# Patient Record
Sex: Male | Born: 1955 | Race: White | Hispanic: No | Marital: Married | Smoking: Former smoker
Health system: Southern US, Community
[De-identification: ages and names within clinical notes are randomized; demographics above are authoritative.]

## PROBLEM LIST (undated history)

## (undated) DIAGNOSIS — G4700 Insomnia, unspecified: Secondary | ICD-10-CM

## (undated) DIAGNOSIS — C61 Malignant neoplasm of prostate: Secondary | ICD-10-CM

## (undated) DIAGNOSIS — R7303 Prediabetes: Secondary | ICD-10-CM

## (undated) DIAGNOSIS — C01 Malignant neoplasm of base of tongue: Secondary | ICD-10-CM

## (undated) DIAGNOSIS — Z923 Personal history of irradiation: Secondary | ICD-10-CM

## (undated) DIAGNOSIS — I82409 Acute embolism and thrombosis of unspecified deep veins of unspecified lower extremity: Secondary | ICD-10-CM

## (undated) DIAGNOSIS — K5090 Crohn's disease, unspecified, without complications: Secondary | ICD-10-CM

## (undated) DIAGNOSIS — J189 Pneumonia, unspecified organism: Secondary | ICD-10-CM

## (undated) DIAGNOSIS — E119 Type 2 diabetes mellitus without complications: Secondary | ICD-10-CM

## (undated) HISTORY — PX: PORT-A-CATH REMOVAL: SHX5289

## (undated) HISTORY — DX: Pneumonia, unspecified organism: J18.9

## (undated) HISTORY — DX: Malignant neoplasm of base of tongue: C01

## (undated) HISTORY — DX: Insomnia, unspecified: G47.00

## (undated) HISTORY — DX: Prediabetes: R73.03

## (undated) HISTORY — DX: Acute embolism and thrombosis of unspecified deep veins of unspecified lower extremity: I82.409

## (undated) HISTORY — DX: Crohn's disease, unspecified, without complications: K50.90

## (undated) HISTORY — PX: OTHER SURGICAL HISTORY: SHX169

## (undated) HISTORY — DX: Type 2 diabetes mellitus without complications: E11.9

## (undated) HISTORY — DX: Personal history of irradiation: Z92.3

## (undated) HISTORY — PX: PEG TUBE PLACEMENT: SUR1034

---

## 1998-12-15 ENCOUNTER — Other Ambulatory Visit: Admission: RE | Admit: 1998-12-15 | Discharge: 1998-12-15 | Payer: Self-pay | Admitting: Gastroenterology

## 2003-09-29 ENCOUNTER — Encounter: Admission: RE | Admit: 2003-09-29 | Discharge: 2003-09-29 | Payer: Self-pay | Admitting: Rheumatology

## 2009-11-08 DIAGNOSIS — C01 Malignant neoplasm of base of tongue: Secondary | ICD-10-CM

## 2009-11-08 HISTORY — DX: Malignant neoplasm of base of tongue: C01

## 2011-04-14 ENCOUNTER — Ambulatory Visit
Admission: RE | Admit: 2011-04-14 | Discharge: 2011-04-14 | Disposition: A | Discharge status: Home / Self Care | Payer: 59 | Source: Ambulatory Visit | Attending: Radiation Oncology | Admitting: Radiation Oncology

## 2011-04-14 DIAGNOSIS — C01 Malignant neoplasm of base of tongue: Secondary | ICD-10-CM | POA: Insufficient documentation

## 2011-04-14 DIAGNOSIS — K5090 Crohn's disease, unspecified, without complications: Secondary | ICD-10-CM | POA: Insufficient documentation

## 2011-04-14 DIAGNOSIS — C770 Secondary and unspecified malignant neoplasm of lymph nodes of head, face and neck: Secondary | ICD-10-CM | POA: Insufficient documentation

## 2011-04-14 DIAGNOSIS — Z79899 Other long term (current) drug therapy: Secondary | ICD-10-CM | POA: Insufficient documentation

## 2011-04-14 DIAGNOSIS — Z87891 Personal history of nicotine dependence: Secondary | ICD-10-CM | POA: Insufficient documentation

## 2011-04-14 DIAGNOSIS — Z86718 Personal history of other venous thrombosis and embolism: Secondary | ICD-10-CM | POA: Insufficient documentation

## 2011-04-14 DIAGNOSIS — Z809 Family history of malignant neoplasm, unspecified: Secondary | ICD-10-CM | POA: Insufficient documentation

## 2011-04-14 DIAGNOSIS — K117 Disturbances of salivary secretion: Secondary | ICD-10-CM | POA: Insufficient documentation

## 2011-04-15 ENCOUNTER — Other Ambulatory Visit: Payer: Self-pay | Admitting: Radiation Oncology

## 2011-06-16 ENCOUNTER — Encounter (HOSPITAL_BASED_OUTPATIENT_CLINIC_OR_DEPARTMENT_OTHER): Payer: 59 | Admitting: Oncology

## 2011-06-16 ENCOUNTER — Other Ambulatory Visit: Payer: Self-pay | Admitting: Oncology

## 2011-06-16 DIAGNOSIS — C01 Malignant neoplasm of base of tongue: Secondary | ICD-10-CM

## 2011-06-16 LAB — CBC WITH DIFFERENTIAL/PLATELET
BASO%: 0.2 % (ref 0.0–2.0)
Basophils Absolute: 0 10*3/uL (ref 0.0–0.1)
EOS%: 2.8 % (ref 0.0–7.0)
Eosinophils Absolute: 0.1 10*3/uL (ref 0.0–0.5)
HCT: 40.7 % (ref 38.4–49.9)
HGB: 14 g/dL (ref 13.0–17.1)
LYMPH%: 17.1 % (ref 14.0–49.0)
MCH: 30 pg (ref 27.2–33.4)
MCHC: 34.4 g/dL (ref 32.0–36.0)
MCV: 87.2 fL (ref 79.3–98.0)
MONO#: 0.4 10*3/uL (ref 0.1–0.9)
MONO%: 8.1 % (ref 0.0–14.0)
NEUT#: 3.3 10*3/uL (ref 1.5–6.5)
NEUT%: 71.8 % (ref 39.0–75.0)
Platelets: 181 10*3/uL (ref 140–400)
RBC: 4.67 10*6/uL (ref 4.20–5.82)
RDW: 12.4 % (ref 11.0–14.6)
WBC: 4.6 10*3/uL (ref 4.0–10.3)
lymph#: 0.8 10*3/uL — ABNORMAL LOW (ref 0.9–3.3)

## 2011-06-16 LAB — COMPREHENSIVE METABOLIC PANEL
ALT: 35 U/L (ref 0–53)
AST: 18 U/L (ref 0–37)
Albumin: 4 g/dL (ref 3.5–5.2)
Alkaline Phosphatase: 54 U/L (ref 39–117)
BUN: 19 mg/dL (ref 6–23)
CO2: 26 mEq/L (ref 19–32)
Calcium: 9.2 mg/dL (ref 8.4–10.5)
Chloride: 102 mEq/L (ref 96–112)
Creatinine, Ser: 1.06 mg/dL (ref 0.50–1.35)
Glucose, Bld: 183 mg/dL — ABNORMAL HIGH (ref 70–99)
Potassium: 4.2 mEq/L (ref 3.5–5.3)
Sodium: 137 mEq/L (ref 135–145)
Total Bilirubin: 0.6 mg/dL (ref 0.3–1.2)
Total Protein: 6.4 g/dL (ref 6.0–8.3)

## 2011-06-16 LAB — TSH: TSH: 1.9 u[IU]/mL (ref 0.350–4.500)

## 2011-07-06 ENCOUNTER — Other Ambulatory Visit: Payer: Self-pay | Admitting: Gastroenterology

## 2011-08-16 ENCOUNTER — Encounter: Payer: Self-pay | Admitting: Oncology

## 2011-08-16 DIAGNOSIS — C01 Malignant neoplasm of base of tongue: Secondary | ICD-10-CM | POA: Insufficient documentation

## 2011-08-24 ENCOUNTER — Encounter: Payer: Self-pay | Admitting: *Deleted

## 2011-08-26 ENCOUNTER — Other Ambulatory Visit: Payer: Self-pay | Admitting: Oncology

## 2011-08-26 DIAGNOSIS — C01 Malignant neoplasm of base of tongue: Secondary | ICD-10-CM

## 2011-08-29 ENCOUNTER — Encounter: Payer: Self-pay | Admitting: Oncology

## 2011-09-03 ENCOUNTER — Encounter: Payer: Self-pay | Admitting: *Deleted

## 2011-09-13 ENCOUNTER — Other Ambulatory Visit: Payer: Self-pay | Admitting: Oncology

## 2011-09-13 ENCOUNTER — Other Ambulatory Visit (HOSPITAL_BASED_OUTPATIENT_CLINIC_OR_DEPARTMENT_OTHER): Payer: BC Managed Care – PPO

## 2011-09-13 DIAGNOSIS — C01 Malignant neoplasm of base of tongue: Secondary | ICD-10-CM

## 2011-09-13 LAB — CBC WITH DIFFERENTIAL/PLATELET
BASO%: 0.8 % (ref 0.0–2.0)
Basophils Absolute: 0 10*3/uL (ref 0.0–0.1)
EOS%: 5.2 % (ref 0.0–7.0)
Eosinophils Absolute: 0.2 10*3/uL (ref 0.0–0.5)
HCT: 43.6 % (ref 38.4–49.9)
HGB: 14.7 g/dL (ref 13.0–17.1)
LYMPH%: 20.9 % (ref 14.0–49.0)
MCH: 29.7 pg (ref 27.2–33.4)
MCHC: 33.8 g/dL (ref 32.0–36.0)
MCV: 87.8 fL (ref 79.3–98.0)
MONO#: 0.3 10*3/uL (ref 0.1–0.9)
MONO%: 8.5 % (ref 0.0–14.0)
NEUT#: 2.4 10*3/uL (ref 1.5–6.5)
NEUT%: 64.6 % (ref 39.0–75.0)
Platelets: 172 10*3/uL (ref 140–400)
RBC: 4.97 10*6/uL (ref 4.20–5.82)
RDW: 13.5 % (ref 11.0–14.6)
WBC: 3.8 10*3/uL — ABNORMAL LOW (ref 4.0–10.3)
lymph#: 0.8 10*3/uL — ABNORMAL LOW (ref 0.9–3.3)

## 2011-09-13 LAB — COMPREHENSIVE METABOLIC PANEL
ALT: 20 U/L (ref 0–53)
AST: 18 U/L (ref 0–37)
Albumin: 4.4 g/dL (ref 3.5–5.2)
Alkaline Phosphatase: 50 U/L (ref 39–117)
BUN: 18 mg/dL (ref 6–23)
CO2: 26 mEq/L (ref 19–32)
Calcium: 9.9 mg/dL (ref 8.4–10.5)
Chloride: 105 mEq/L (ref 96–112)
Creatinine, Ser: 1.15 mg/dL (ref 0.50–1.35)
Glucose, Bld: 121 mg/dL — ABNORMAL HIGH (ref 70–99)
Potassium: 4.3 mEq/L (ref 3.5–5.3)
Sodium: 140 mEq/L (ref 135–145)
Total Bilirubin: 0.7 mg/dL (ref 0.3–1.2)
Total Protein: 6.7 g/dL (ref 6.0–8.3)

## 2011-09-16 ENCOUNTER — Ambulatory Visit (HOSPITAL_COMMUNITY)
Admission: RE | Admit: 2011-09-16 | Discharge: 2011-09-16 | Disposition: A | Discharge status: Home / Self Care | Payer: BC Managed Care – PPO | Source: Ambulatory Visit | Attending: Radiation Oncology | Admitting: Radiation Oncology

## 2011-09-16 ENCOUNTER — Encounter (HOSPITAL_COMMUNITY): Payer: Self-pay

## 2011-09-16 DIAGNOSIS — Z9221 Personal history of antineoplastic chemotherapy: Secondary | ICD-10-CM | POA: Insufficient documentation

## 2011-09-16 DIAGNOSIS — Z859 Personal history of malignant neoplasm, unspecified: Secondary | ICD-10-CM | POA: Insufficient documentation

## 2011-09-16 DIAGNOSIS — Z923 Personal history of irradiation: Secondary | ICD-10-CM | POA: Insufficient documentation

## 2011-09-16 DIAGNOSIS — J984 Other disorders of lung: Secondary | ICD-10-CM | POA: Insufficient documentation

## 2011-09-16 DIAGNOSIS — M542 Cervicalgia: Secondary | ICD-10-CM | POA: Insufficient documentation

## 2011-09-16 DIAGNOSIS — J3489 Other specified disorders of nose and nasal sinuses: Secondary | ICD-10-CM | POA: Insufficient documentation

## 2011-09-16 MED ORDER — IOHEXOL 300 MG/ML  SOLN
100.0000 mL | Freq: Once | INTRAMUSCULAR | Status: AC | PRN
  Administered 2011-09-16: 100 mL via INTRAVENOUS

## 2011-09-17 ENCOUNTER — Ambulatory Visit: Payer: 59 | Admitting: Oncology

## 2011-09-21 ENCOUNTER — Telehealth: Payer: Self-pay | Admitting: *Deleted

## 2011-09-21 NOTE — Telephone Encounter (Signed)
Called pt back and informed him of response from [name].  Informed him that his CT neck showed no significant findings and he will discuss results on pt's next appointment. Pt verbalized understanding.

## 2011-09-21 NOTE — Telephone Encounter (Signed)
Please tell patient that I normally discuss result in a visit.  His next visit is 10/01/11.   But you can tell him that there was no significant finding on the last neck CT.  Thanks.

## 2011-09-21 NOTE — Telephone Encounter (Signed)
Pt called requesting results of his CT from 09/16/11.  States he has not heard results from anyone yet.

## 2011-09-25 ENCOUNTER — Other Ambulatory Visit: Payer: Self-pay | Admitting: Oncology

## 2011-09-29 ENCOUNTER — Ambulatory Visit
Admission: RE | Admit: 2011-09-29 | Discharge: 2011-09-29 | Disposition: A | Discharge status: Home / Self Care | Payer: BC Managed Care – PPO | Source: Ambulatory Visit | Attending: Radiation Oncology | Admitting: Radiation Oncology

## 2011-09-29 VITALS — BP 120/80 | HR 80 | Temp 98.4°F | Ht 71.0 in | Wt 153.1 lb

## 2011-09-29 DIAGNOSIS — Z86718 Personal history of other venous thrombosis and embolism: Secondary | ICD-10-CM | POA: Insufficient documentation

## 2011-09-29 DIAGNOSIS — C01 Malignant neoplasm of base of tongue: Secondary | ICD-10-CM

## 2011-09-29 NOTE — Progress Notes (Signed)
Please see the Nurse Progress Note in the MD Initial Consult Encounter for this patient. 

## 2011-09-29 NOTE — Progress Notes (Signed)
To review ct of neck 09/16/11.

## 2011-09-30 NOTE — Progress Notes (Signed)
CC:   Anthony H. Pollyann Kennedy, MD [name], M.D. 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  PREVIOUS RADIATION:  Concurrent radiation and Erbitux for a total dose of 70.2 Gy completed 07/03/2010.  INTERVAL SINCE TREATMENT:  1 year and 4 months.  INTERVAL HISTORY:  [name] reports for followup today.  I saw him in June when he moved here and established care.  He has been seen by [name] as well as [name] and [name].  He states that his C difficile colitis has gotten better and, because of that, he has gained about 12 pounds.  He did see Dr. [name] for a molar.  This was able to be removed and he did not need hyperbaric oxygen treatments.  He has been having some on and off pain in his left neck which is better since about 2 months ago when it was occurring everyday and was not relieved with tramadol.  He saw [name] who gave him "super steroid" which completely relieved his pain.  He continues to have this pain which again is on and off throughout the day and is not associated with any ear pain.  No change in his swallowing and no difference when he talks. He noticed some increase of this pain at night after he spent most of the day talking.  It radiates to underneath his jaw.  He states it is the same pain he went to [name] for several months ago.  It is not something bad enough that he would like a referral to any pain specialist, he states.  Otherwise his dry mouth is stable and his taste changes and tinnitus are stable.  He is being followed by his primary care physician for hypothyroidism and states that he had his labs drawn and all were fine.  He is not smoking.  PHYSICAL EXAMINATION:  General:  He is a pleasant male in no distress sitting comfortably on the exam room table.  Vital signs:  Weight 153 pounds which is up 7 pounds from our last weight.  Height 5 feet 11 inches.  Blood pressure 120/80, pulse 80,  temperature 98.4.  He has no palpable cervical, submandibular or supraclavicular adenopathy.  He is tender to palpation along his left thyroid cartilage but there is no associated mass.  Examination of his oral cavity reveals stained teeth and some periodontal disease.  He does have a gold cap in place in the right posterior mandible.  No evidence of lesions in the oral cavity. Indirect mirror examination shows no lesions in the right or left base of tongue.  His vocal cords are intact and move normally with no lesions in the vocal cords or epiglottis.  He is alert and oriented x3.  IMAGING:  He had a CT of the head and neck on 09/16/2011 which showed soft tissue stranding along the left neck with no abnormal lymph nodes. No abnormality was noted in the place of a marker on the spot where he is complaining of pain.  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  RECOMMENDATIONS:  [name] really looks great.  He is gaining weight and not having any problems with swallowing.  This left neck pain could be necrosis of the thyroid cartilage.  He is well controlled on occasional Ultram at this time and has no desire for further pain medication.  He states that  [name] examined him several months ago when he was having this pain and could not find any etiology.  I do not see anything evident on physical exam at this time.  His imaging is normal.  We discussed followup in about 6 months and, in the interim, he will be seen either by [name] or [name].  He will also follow up with his regular primary care physician and [name] for his dental disease.  We discussed the NCCN recommendations regarding imaging and imaging only with new or worrisome symptoms.  I discussed with him that I would not be ordering imaging before his next followup appointment and I will plan on seeing him back in about 6 months.  At that time he will be almost  2 years out from treatment.  We discussed the low likelihood of recurrence after 2 years.  We did discuss the necessity of followup through 5 years for secondary malignancies including lung and esophagus. He did mention that he is interviewing for jobs in [place] and [place] and offered him referral for followup with those physicians if necessary.  He will call [name] for a followup appointment.  I will see him back in 6 months.    ______________________________ Lurline Hare, M.D. SW/MEDQ  D:  09/30/2011  T:  09/30/2011  Job:  [phone]

## 2011-10-01 ENCOUNTER — Ambulatory Visit: Payer: 59 | Admitting: Oncology

## 2011-10-07 ENCOUNTER — Encounter: Payer: Self-pay | Admitting: *Deleted

## 2011-10-07 NOTE — Progress Notes (Signed)
Distress Screening  CSW was referred by distress screening protocol. Pt scored an 8 on DT. CSW unable to meet with pt in exam room, therefore, CSW called pt at home.  Pt stated he was doing "great", and had recently completed the program Finding Your New Normal at [facility].  Pt was very familiar with the patient and family support programs at [facility].  Pt stated he had completed his treatments and was "feeling great".  CSW and pt discussed his score of 8 on the DT.  Pt reported that he had recently become unemployed, which was a major stressor.  Pt stated he that he has been interviewing and should have a job in the next couple of days.  Pt was thankful for the contact and praised his physician for their empathetic and kind nature.

## 2011-12-11 ENCOUNTER — Telehealth: Payer: Self-pay | Admitting: Oncology

## 2011-12-11 NOTE — Telephone Encounter (Signed)
lmonvm for pt re appt for 3/1. Schedule mailed. Pt had pending appts for feb/aug but missed a nov 2012 f/u. Per [facility] schedule f/u for march.

## 2011-12-24 ENCOUNTER — Encounter: Payer: Self-pay | Admitting: *Deleted

## 2012-01-07 ENCOUNTER — Other Ambulatory Visit: Payer: BC Managed Care – PPO | Admitting: Lab

## 2012-01-07 ENCOUNTER — Ambulatory Visit: Payer: BC Managed Care – PPO | Admitting: Oncology

## 2012-01-10 NOTE — Progress Notes (Signed)
No show

## 2012-01-23 ENCOUNTER — Emergency Department (HOSPITAL_COMMUNITY): Payer: 59

## 2012-01-23 ENCOUNTER — Emergency Department (HOSPITAL_COMMUNITY)
Admission: EM | Admit: 2012-01-23 | Discharge: 2012-01-23 | Disposition: A | Discharge status: Home / Self Care | Payer: 59 | Attending: Emergency Medicine | Admitting: Emergency Medicine

## 2012-01-23 ENCOUNTER — Encounter (HOSPITAL_COMMUNITY): Payer: Self-pay | Admitting: Emergency Medicine

## 2012-01-23 DIAGNOSIS — Z85810 Personal history of malignant neoplasm of tongue: Secondary | ICD-10-CM | POA: Insufficient documentation

## 2012-01-23 DIAGNOSIS — K5900 Constipation, unspecified: Secondary | ICD-10-CM | POA: Insufficient documentation

## 2012-01-23 DIAGNOSIS — Z86718 Personal history of other venous thrombosis and embolism: Secondary | ICD-10-CM | POA: Insufficient documentation

## 2012-01-23 DIAGNOSIS — Z8719 Personal history of other diseases of the digestive system: Secondary | ICD-10-CM | POA: Insufficient documentation

## 2012-01-23 DIAGNOSIS — R109 Unspecified abdominal pain: Secondary | ICD-10-CM | POA: Insufficient documentation

## 2012-01-23 LAB — COMPREHENSIVE METABOLIC PANEL
ALT: 16 U/L (ref 0–53)
AST: 16 U/L (ref 0–37)
CO2: 26 mEq/L (ref 19–32)
Calcium: 9.6 mg/dL (ref 8.4–10.5)
Chloride: 103 mEq/L (ref 96–112)
GFR calc non Af Amer: 81 mL/min — ABNORMAL LOW (ref >90)
Sodium: 137 mEq/L (ref 135–145)

## 2012-01-23 LAB — URINALYSIS, ROUTINE W REFLEX MICROSCOPIC
Glucose, UA: NEGATIVE mg/dL
Hgb urine dipstick: NEGATIVE
Protein, ur: NEGATIVE mg/dL
Specific Gravity, Urine: 1.016 (ref 1.005–1.030)

## 2012-01-23 LAB — CBC
Platelets: 162 10*3/uL (ref 150–400)
RDW: 12.7 % (ref 11.5–15.5)
WBC: 6.2 10*3/uL (ref 4.0–10.5)

## 2012-01-23 LAB — DIFFERENTIAL
Basophils Absolute: 0 10*3/uL (ref 0.0–0.1)
Eosinophils Relative: 8 % — ABNORMAL HIGH (ref 0–5)
Lymphocytes Relative: 17 % (ref 12–46)
Neutro Abs: 4.1 10*3/uL (ref 1.7–7.7)
Neutrophils Relative %: 66 % (ref 43–77)

## 2012-01-23 MED ORDER — MAGNESIUM CITRATE PO SOLN: 296.0000 mL | Freq: Once | ORAL | Status: AC

## 2012-01-23 MED ORDER — SODIUM CHLORIDE 0.9 % IV SOLN
INTRAVENOUS | Status: DC
  Administered 2012-01-23: 10:00:00 via INTRAVENOUS

## 2012-01-23 MED ORDER — KETOROLAC TROMETHAMINE 30 MG/ML IJ SOLN
30.0000 mg | Freq: Once | INTRAMUSCULAR | Status: AC
  Administered 2012-01-23: 30 mg via INTRAVENOUS
  Filled 2012-01-23: qty 1

## 2012-01-23 MED ORDER — OXYCODONE-ACETAMINOPHEN 5-325 MG PO TABS: 2.0000 | ORAL_TABLET | ORAL | Status: AC | PRN

## 2012-01-23 NOTE — ED Provider Notes (Signed)
History     CSN: [id number]  Arrival date & time 01/23/12  4098   First MD Initiated Contact with Patient 01/23/12 [phone]      Chief Complaint  Patient presents with  . Flank Pain    rt side    (Consider location/radiation/quality/duration/timing/severity/associated sxs/prior treatment) Patient is a 56 y.o. male presenting with flank pain. The history is provided by the patient.  Flank Pain Associated symptoms include abdominal pain. Pertinent negatives include no chest pain, no headaches and no shortness of breath.   the patient is a 56 year old, male, with a history of Crohn's disease, and partial bowel resection, who presents to emergency department complaining of roughly 5 days, history of abdominal pain, with no bowel movements.  2 days ago.  The pain migrated to his right flank and now.  It is been persistent.  There.  He also has a history of kidney stones.  He feels as if the flank pain is consistent with one of his kidney stones.  He has not had nausea or vomiting.  He denies fevers, cough, shortness of breath, or hematuria.  Past Medical History  Diagnosis Date  . Crohn disease 1998  . Deep vein thrombosis   . Colitis due to Clostridium difficile   . Borderline diabetes   . 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 No pertinent past surgical history.  History reviewed. No pertinent family history.  History  Substance Use Topics  . Smoking status: Never Smoker   . Smokeless tobacco: Not on file  . Alcohol Use: Yes      Review of Systems  Constitutional: Negative for fever and chills.  Respiratory: Negative for cough and shortness of breath.   Cardiovascular: Negative for chest pain.  Gastrointestinal: Positive for abdominal pain and constipation. Negative for nausea, vomiting and diarrhea.  Genitourinary: Positive for flank pain. Negative for dysuria and hematuria.  Neurological: Negative for headaches.    Psychiatric/Behavioral: Negative for confusion.  All other systems reviewed and are negative.    Allergies  Ciprofloxacin; Levofloxacin; and Moxifloxacin  Home Medications   Current Outpatient Rx  Name Route Sig Dispense Refill  . ALPRAZOLAM 0.25 MG PO TABS Oral Take 0.25 mg by mouth daily as needed. For anxiety.    Adele Dan 66.02-25-74 MU PO CPEP Oral Take 2 capsules by mouth 3 (three) times daily as needed. To aid in food digestion.    . BUDESONIDE ER 3 MG PO CP24 Oral Take 3 mg by mouth every morning.      Marland Kitchen VITAMIN D-3 5000 UNITS PO TABS Oral Take 2 tablets by mouth daily.    Marland Kitchen VITAMIN B 12 PO Oral Take 2 tablets by mouth daily.    Marland Kitchen FLAXSEED (LINSEED) PO OIL Oral Take 2 tablets by mouth daily.     Marland Kitchen OVER THE COUNTER MEDICATION Oral Take 2 tablets by mouth daily. Tumeric    . POLYETHYLENE GLYCOL 3350 PO PACK Oral Take 17 g by mouth daily as needed. For constipation.    . TRAMADOL HCL 50 MG PO TABS Oral Take 50 mg by mouth every 8 (eight) hours as needed. For pain.    Marland Kitchen VALACYCLOVIR HCL 500 MG PO TABS Oral Take 500 mg by mouth daily as needed. For outbreaks.      BP 136/83  Pulse 81  Temp(Src) 97.5 F (36.4 C) (Oral)  Resp 20  SpO2 97%  Physical Exam  Vitals reviewed. Constitutional:  He is oriented to person, place, and time. He appears well-developed and well-nourished.       Uncomfortable appear intermittently grimaces as if in severe pain  HENT:  Head: Normocephalic and atraumatic.  Eyes: Conjunctivae are normal. Pupils are equal, round, and reactive to light.  Neck: Normal range of motion. Neck supple.  Cardiovascular: Normal rate.   No murmur heard. Pulmonary/Chest: Effort normal. No respiratory distress.  Abdominal: Soft. He exhibits no distension and no mass. There is tenderness. There is no rebound and no guarding.       Mild lower abdominal tenderness, with no peritoneal signs, and no distention  Musculoskeletal: Normal range of motion. He  exhibits no edema.  Neurological: He is alert and oriented to person, place, and time.  Skin: Skin is warm and dry.  Psychiatric: He has a normal mood and affect. Thought content normal.    ED Course  Procedures (including critical care time) 56 year old, male, with a history of Crohn's disease, presents to emergency department with periumbilical pain for 5 days, as migrated to his right flank for the past 2 days.  He has not had a bowel movement.  Denies abdominal distention, or nausea, vomiting, which have occurred with his previous bowel obstructions.  On examination, he looks uncomfortable.  He has mild lower abdominal tenderness without peritoneal signs.  No distention.  We will establish an IV given analgesics for his symptom and perform laboratory testing, and an acute abdominal series to look for signs of obstruction   Labs Reviewed  CBC  DIFFERENTIAL  COMPREHENSIVE METABOLIC PANEL  LIPASE, BLOOD  URINALYSIS, ROUTINE W REFLEX MICROSCOPIC   No results found.   No diagnosis found.   Pain resolved after toradol  MDM  Constipation No acute abdomen. No sbo. No hematuria to suggest renal stones.        Cheri Guppy, MD 01/23/12 1153

## 2012-01-23 NOTE — ED Notes (Signed)
Pt states that he thinks he may have either kidney stones or an intestinal blockage.  Hx of crohn's disease and kidney stones.  Has been taking a laxative for 2 days with no results except for "unbelievable gas" and one watery bowel movement.  Last "normal" bowel movement was 5 days ago.  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  Pt states his pain is more consistent with a kidney stone.  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  Denies any trouble urinating.  No N/V.

## 2012-01-23 NOTE — Discharge Instructions (Signed)
Your x-ray shows a large amount of stool however, there is no evidence of obstruction.  Her urine does not show any blood.  Therefore, a kidney stone is unlikely.  Your blood tests do not show any severe illness.  Use magnesium citrate to promote bowel movements.  Use ibuprofen 600 mg every 6 hours for pain.  Use Percocet for uncontrolled pain.  Followup with your Dr. if your symptoms.  Last more than 2-3 days.  Return for worse or uncontrolled symptoms

## 2012-02-15 ENCOUNTER — Emergency Department (HOSPITAL_COMMUNITY): Payer: 59

## 2012-02-15 ENCOUNTER — Encounter (HOSPITAL_COMMUNITY): Payer: Self-pay | Admitting: Emergency Medicine

## 2012-02-15 ENCOUNTER — Emergency Department (HOSPITAL_COMMUNITY)
Admission: EM | Admit: 2012-02-15 | Discharge: 2012-02-15 | Disposition: A | Discharge status: Home / Self Care | Payer: 59 | Attending: Emergency Medicine | Admitting: Emergency Medicine

## 2012-02-15 DIAGNOSIS — R6883 Chills (without fever): Secondary | ICD-10-CM | POA: Insufficient documentation

## 2012-02-15 DIAGNOSIS — K5900 Constipation, unspecified: Secondary | ICD-10-CM | POA: Insufficient documentation

## 2012-02-15 DIAGNOSIS — M549 Dorsalgia, unspecified: Secondary | ICD-10-CM | POA: Insufficient documentation

## 2012-02-15 DIAGNOSIS — R10811 Right upper quadrant abdominal tenderness: Secondary | ICD-10-CM | POA: Insufficient documentation

## 2012-02-15 DIAGNOSIS — R10816 Epigastric abdominal tenderness: Secondary | ICD-10-CM | POA: Insufficient documentation

## 2012-02-15 DIAGNOSIS — Z86718 Personal history of other venous thrombosis and embolism: Secondary | ICD-10-CM | POA: Insufficient documentation

## 2012-02-15 DIAGNOSIS — R10815 Periumbilic abdominal tenderness: Secondary | ICD-10-CM | POA: Insufficient documentation

## 2012-02-15 DIAGNOSIS — R109 Unspecified abdominal pain: Secondary | ICD-10-CM | POA: Insufficient documentation

## 2012-02-15 DIAGNOSIS — R110 Nausea: Secondary | ICD-10-CM

## 2012-02-15 DIAGNOSIS — Z85810 Personal history of malignant neoplasm of tongue: Secondary | ICD-10-CM | POA: Insufficient documentation

## 2012-02-15 DIAGNOSIS — R7309 Other abnormal glucose: Secondary | ICD-10-CM | POA: Insufficient documentation

## 2012-02-15 DIAGNOSIS — K5090 Crohn's disease, unspecified, without complications: Secondary | ICD-10-CM | POA: Insufficient documentation

## 2012-02-15 LAB — CBC
HCT: 41.5 % (ref 39.0–52.0)
Hemoglobin: 14.5 g/dL (ref 13.0–17.0)
MCH: 30.1 pg (ref 26.0–34.0)
MCHC: 34.9 g/dL (ref 30.0–36.0)
MCV: 86.1 fL (ref 78.0–100.0)
Platelets: 122 K/uL — ABNORMAL LOW (ref 150–400)
RBC: 4.82 MIL/uL (ref 4.22–5.81)
RDW: 12.9 % (ref 11.5–15.5)
WBC: 8.8 10*3/uL (ref 4.0–10.5)

## 2012-02-15 LAB — URINALYSIS, ROUTINE W REFLEX MICROSCOPIC
Bilirubin Urine: NEGATIVE
Glucose, UA: NEGATIVE mg/dL
Hgb urine dipstick: NEGATIVE
Leukocytes, UA: NEGATIVE
Nitrite: NEGATIVE
Protein, ur: NEGATIVE mg/dL
Specific Gravity, Urine: 1.02 (ref 1.005–1.030)
Urobilinogen, UA: 0.2 mg/dL (ref 0.0–1.0)
pH: 6 (ref 5.0–8.0)

## 2012-02-15 LAB — COMPREHENSIVE METABOLIC PANEL
CO2: 24 mEq/L (ref 19–32)
Calcium: 9.3 mg/dL (ref 8.4–10.5)
Creatinine, Ser: 0.8 mg/dL (ref 0.50–1.35)
GFR calc Af Amer: >90 mL/min (ref >90)
GFR calc non Af Amer: >90 mL/min (ref >90)
Glucose, Bld: 117 mg/dL — ABNORMAL HIGH (ref 70–99)
Total Bilirubin: 0.6 mg/dL (ref 0.3–1.2)

## 2012-02-15 LAB — COMPREHENSIVE METABOLIC PANEL WITH GFR
ALT: 22 U/L (ref 0–53)
AST: 23 U/L (ref 0–37)
Albumin: 3.7 g/dL (ref 3.5–5.2)
Alkaline Phosphatase: 52 U/L (ref 39–117)
BUN: 18 mg/dL (ref 6–23)
Chloride: 104 meq/L (ref 96–112)
Potassium: 3.5 meq/L (ref 3.5–5.1)
Sodium: 138 meq/L (ref 135–145)
Total Protein: 6.6 g/dL (ref 6.0–8.3)

## 2012-02-15 LAB — DIFFERENTIAL
Basophils Absolute: 0 K/uL (ref 0.0–0.1)
Basophils Relative: 0 % (ref 0–1)
Eosinophils Absolute: 0.2 K/uL (ref 0.0–0.7)
Eosinophils Relative: 3 % (ref 0–5)
Lymphocytes Relative: 3 % — ABNORMAL LOW (ref 12–46)
Lymphs Abs: 0.2 10*3/uL — ABNORMAL LOW (ref 0.7–4.0)
Monocytes Absolute: 0.5 10*3/uL (ref 0.1–1.0)
Monocytes Relative: 5 % (ref 3–12)
Neutro Abs: 7.8 K/uL — ABNORMAL HIGH (ref 1.7–7.7)
Neutrophils Relative %: 89 % — ABNORMAL HIGH (ref 43–77)

## 2012-02-15 LAB — LIPASE, BLOOD: Lipase: 7 U/L — ABNORMAL LOW (ref 11–59)

## 2012-02-15 MED ORDER — ONDANSETRON HCL 4 MG/2ML IJ SOLN
4.0000 mg | Freq: Once | INTRAMUSCULAR | Status: AC
  Administered 2012-02-15: 4 mg via INTRAVENOUS
  Filled 2012-02-15: qty 2

## 2012-02-15 MED ORDER — SODIUM CHLORIDE 0.9 % IV SOLN
Freq: Once | INTRAVENOUS | Status: AC
  Administered 2012-02-15: 09:00:00 via INTRAVENOUS

## 2012-02-15 MED ORDER — HYDROMORPHONE HCL PF 1 MG/ML IJ SOLN
1.0000 mg | Freq: Once | INTRAMUSCULAR | Status: AC
  Administered 2012-02-15: 1 mg via INTRAVENOUS
  Filled 2012-02-15: qty 1

## 2012-02-15 MED ORDER — ONDANSETRON 8 MG PO TBDP: 8.0000 mg | ORAL_TABLET | Freq: Two times a day (BID) | ORAL | Status: AC | PRN

## 2012-02-15 NOTE — Discharge Instructions (Signed)
Abdominal Pain  Abdominal pain can be caused by many things. Your caregiver decides the seriousness of your pain by an examination and possibly blood tests and X-rays. Many cases can be observed and treated at home. Most abdominal pain is not caused by a disease and will probably improve without treatment. However, in many cases, more time must pass before a clear cause of the pain can be found. Before that point, it may not be known if you need more testing, or if hospitalization or surgery is needed.  HOME CARE INSTRUCTIONS    Do not take laxatives unless directed by your caregiver.   Take pain medicine only as directed by your caregiver.   Only take over-the-counter or prescription medicines for pain, discomfort, or fever as directed by your caregiver.   Try a clear liquid diet (broth, tea, or water) for as long as directed by your caregiver. Slowly move to a bland diet as tolerated.  SEEK IMMEDIATE MEDICAL CARE IF:    The pain does not go away.   You have a fever.   You keep throwing up (vomiting).   The pain is felt only in portions of the abdomen. Pain in the right side could possibly be appendicitis. In an adult, pain in the left lower portion of the abdomen could be colitis or diverticulitis.   You pass bloody or black tarry stools.  MAKE SURE YOU:    Understand these instructions.   Will watch your condition.   Will get help right away if you are not doing well or get worse.  Document Released: 08/04/2005 Document Revised: 10/14/2011 Document Reviewed: 06/12/2008  Exit[employer] Patient Information 2012 [employer].    Nausea and Vomiting  Nausea is a sick feeling that often comes before throwing up (vomiting). Vomiting is a reflex where stomach contents come out of your mouth. Vomiting can cause severe loss of body fluids (dehydration). Children and elderly adults can become dehydrated quickly, especially if they also have diarrhea. Nausea and vomiting are symptoms of a condition or disease. It  is important to find the cause of your symptoms.  CAUSES    Direct irritation of the stomach lining. This irritation can result from increased acid production (gastroesophageal reflux disease), infection, food poisoning, taking certain medicines (such as nonsteroidal anti-inflammatory drugs), alcohol use, or tobacco use.   Signals from the brain.These signals could be caused by a headache, heat exposure, an inner ear disturbance, increased pressure in the brain from injury, infection, a tumor, or a concussion, pain, emotional stimulus, or metabolic problems.   An obstruction in the gastrointestinal tract (bowel obstruction).   Illnesses such as diabetes, hepatitis, gallbladder problems, appendicitis, kidney problems, cancer, sepsis, atypical symptoms of a heart attack, or eating disorders.   Medical treatments such as chemotherapy and radiation.   Receiving medicine that makes you sleep (general anesthetic) during surgery.  DIAGNOSIS  Your caregiver may ask for tests to be done if the problems do not improve after a few days. Tests may also be done if symptoms are severe or if the reason for the nausea and vomiting is not clear. Tests may include:   Urine tests.   Blood tests.   Stool tests.   Cultures (to look for evidence of infection).   X-rays or other imaging studies.  Test results can help your caregiver make decisions about treatment or the need for additional tests.  TREATMENT  You need to stay well hydrated. Drink frequently but in small amounts.You may wish to   drink water, sports drinks, clear broth, or eat frozen ice pops or gelatin dessert to help stay hydrated.When you eat, eating slowly may help prevent nausea.There are also some antinausea medicines that may help prevent nausea.  HOME CARE INSTRUCTIONS    Take all medicine as directed by your caregiver.   If you do not have an appetite, do not force yourself to eat. However, you must continue to drink fluids.   If you have an  appetite, eat a normal diet unless your caregiver tells you differently.   Eat a variety of complex carbohydrates (rice, wheat, potatoes, bread), lean meats, yogurt, fruits, and vegetables.   Avoid high-fat foods because they are more difficult to digest.   Drink enough water and fluids to keep your urine clear or pale yellow.   If you are dehydrated, ask your caregiver for specific rehydration instructions. Signs of dehydration may include:   Severe thirst.   Dry lips and mouth.   Dizziness.   Dark urine.   Decreasing urine frequency and amount.   Confusion.   Rapid breathing or pulse.  SEEK IMMEDIATE MEDICAL CARE IF:    You have blood or brown flecks (like coffee grounds) in your vomit.   You have black or bloody stools.   You have a severe headache or stiff neck.   You are confused.   You have severe abdominal pain.   You have chest pain or trouble breathing.   You do not urinate at least once every 8 hours.   You develop cold or clammy skin.   You continue to vomit for longer than 24 to 48 hours.   You have a fever.  MAKE SURE YOU:    Understand these instructions.   Will watch your condition.   Will get help right away if you are not doing well or get worse.  Document Released: 10/25/2005 Document Revised: 10/14/2011 Document Reviewed: 03/24/2011  [employer] Patient Information 2012 [employer].

## 2012-02-15 NOTE — ED Notes (Signed)
Tolerated po's-no nausea/vomiting noted

## 2012-02-15 NOTE — ED Notes (Signed)
Pt. Was given a urinal to urinate

## 2012-02-15 NOTE — ED Notes (Signed)
GEX:BM84<XL> Expected date:02/15/12<BR> Expected time: 7:37 AM<BR> Means of arrival:Ambulance<BR> Comments:<BR> nausea

## 2012-02-15 NOTE — ED Notes (Signed)
Change pt. Into gown,pt. Drinking water,and has pain level is 8

## 2012-02-15 NOTE — ED Notes (Signed)
Snacks, fluids given-will monitor tolerence

## 2012-02-15 NOTE — ED Notes (Signed)
PER EMS-PT DEVELOPED PANCREATITIS FROM CANCER TREATMENTS-NAUSEA FOR ONE DAY-STATES RECENTLY DIAGNOSED WITH BLOCKED DUCT-DUE TO HAVE MRI ON FRIDAY

## 2012-02-15 NOTE — ED Provider Notes (Signed)
History     CSN: [id number]  Arrival date & time 02/15/12  0801   First MD Initiated Contact with Patient 02/15/12 [phone]      Chief Complaint  Patient presents with  . Pancreatitis    (Consider location/radiation/quality/duration/timing/severity/associated sxs/prior treatment) HPI Comments: Patient with significant history of Crohn's disease as well as prior history of throat cancer status post radiation has been having issues with some gradually worsening abdominal pain over the past one to 2 weeks. He was seen in the emergency department for constipation approximately one month ago. Since then he has followed up with his gastroenterologist who is associated with [facility], and had a CT scan performed about 3 weeks ago. He reports he had biliary ductal dilatation of unclear etiology. He is scheduled for an MRI later this week. He reports this morning he woke up very intensely nauseated and and he likens it to when he was on chemotherapy. He arrives here by EMS due to this nausea and did receive IV Zofran which she reports helped a little bit but seems to be wearing off. He does have some abdominal pain mostly in the middle and right upper region. He reports he has had some mild discomfort that he has been dealing with at home over the past one to 2 weeks. He reports typically he does not have abdominal pain associated with Crohn's. 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 He denies fevers but may have some chills this early morning. No recent blood in his stool. He has increased bowel movements, but denies diarrhea. He has not yet vomited this morning. Denies chest pain or URI symptoms. He does have a mild chronic dry cough secondary to the radiation.  The history is provided by the patient and a relative.    Past Medical History  Diagnosis Date  . Crohn disease 1998  . Deep vein thrombosis   . Colitis due to Clostridium difficile   .  Borderline diabetes   . 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 No pertinent past surgical history.  History reviewed. No pertinent family history.  History  Substance Use Topics  . Smoking status: Never Smoker   . Smokeless tobacco: Not on file  . Alcohol Use: Yes      Review of Systems  Constitutional: Positive for chills.  Gastrointestinal: Positive for nausea, abdominal pain and constipation. Negative for vomiting, diarrhea and blood in stool.  Genitourinary: Negative for dysuria and flank pain.  Musculoskeletal: Positive for back pain.  All other systems reviewed and are negative.    Allergies  Ciprofloxacin; Levofloxacin; and Moxifloxacin  Home Medications   Current Outpatient Rx  Name Route Sig Dispense Refill  . ALPRAZOLAM 0.25 MG PO TABS Oral Take 0.25 mg by mouth daily as needed. For anxiety.    Adele Dan 66.02-25-74 MU PO CPEP Oral Take 2 capsules by mouth 3 (three) times daily as needed. To aid in food digestion.    . BUDESONIDE ER 3 MG PO CP24 Oral Take 3 mg by mouth every morning.      Marland Kitchen VITAMIN D-3 5000 UNITS PO TABS Oral Take 2 tablets by mouth daily.    Marland Kitchen VITAMIN B 12 PO Oral Take 2 tablets by mouth daily.    Marland Kitchen FLAXSEED (LINSEED) PO OIL Oral Take 2 tablets by mouth daily.     . IBUPROFEN 200 MG PO TABS Oral Take 600  mg by mouth every 6 (six) hours as needed. PAIN    . OVER THE COUNTER MEDICATION Oral Take 2 tablets by mouth daily. Tumeric    . POLYETHYLENE GLYCOL 3350 PO PACK Oral Take 17 g by mouth daily as needed. For constipation.    . TRAMADOL HCL 50 MG PO TABS Oral Take 50 mg by mouth every 8 (eight) hours as needed. For pain.    Marland Kitchen VALACYCLOVIR HCL 500 MG PO TABS Oral Take 500 mg by mouth daily as needed. For outbreaks.    Marland Kitchen ONDANSETRON 8 MG PO TBDP Oral Take 1 tablet (8 mg total) by mouth every 12 (twelve) hours as needed for nausea. 20 tablet 0    BP 95/56  Pulse 88  Temp(Src)  98.7 F (37.1 C) (Oral)  Resp 12  SpO2 95%  Physical Exam  Nursing note and vitals reviewed. Constitutional: He appears well-developed and well-nourished. He appears distressed.  HENT:  Head: Normocephalic and atraumatic.  Eyes: Pupils are equal, round, and reactive to light. No scleral icterus.  Neck: Normal range of motion. Neck supple.  Cardiovascular: Normal rate.   Pulmonary/Chest: Effort normal and breath sounds normal. No accessory muscle usage. No respiratory distress. He has no wheezes. He has no rales.       Dry cough present  Abdominal: Soft. There is tenderness in the right upper quadrant, epigastric area and periumbilical area. There is no rebound, no guarding and no CVA tenderness.  Neurological: He is alert.  Skin: Skin is warm and dry. No rash noted. He is not diaphoretic.  Psychiatric: He has a normal mood and affect.    ED Course  Procedures (including critical care time)  Labs Reviewed  CBC - Abnormal; Notable for the following:    Platelets 122 (*)    All other components within normal limits  DIFFERENTIAL - Abnormal; Notable for the following:    Neutrophils Relative 89 (*)    Neutro Abs 7.8 (*)    Lymphocytes Relative 3 (*)    Lymphs Abs 0.2 (*)    All other components within normal limits  COMPREHENSIVE METABOLIC PANEL - Abnormal; Notable for the following:    Glucose, Bld 117 (*)    All other components within normal limits  LIPASE, BLOOD - Abnormal; Notable for the following:    Lipase 7 (*)    All other components within normal limits  URINALYSIS, ROUTINE W REFLEX MICROSCOPIC - Abnormal; Notable for the following:    Ketones, ur TRACE (*)    All other components within normal limits   US Abdomen Complete  02/15/2012  *RADIOLOGY REPORT*  Clinical Data:  Pancreatitis.  COMPLETE ABDOMINAL ULTRASOUND  Comparison:  Acute abdominal series 01/23/2012.  Findings:  Gallbladder:  Normal appearance of the gallbladder.  No stones, sludge, pericholecystic  fluid, or wall thickening.  Gallbladder wall measures under 3 mm, normal.  No sonographic Murphy's sign.  Common bile duct:  4 mm.  Liver:  Normal hepatic echotexture.  No mass lesions.  IVC:  Appears normal.  Pancreas:  Obscured by overlying bowel gas.  Spleen:  8.2 cm.  Normal echotexture.  Right Kidney:  Right upper renal pole simple cyst measuring 28 mm x 34 mm x 32 mm.  Normal central sinus echo complex.  Left Kidney:  11.6 cm. Normal echotexture.  Normal central sinus echo complex.  No calculi or hydronephrosis.  Abdominal aorta:  No aneurysm identified.  IMPRESSION: 1.  Pancreas obscured by overlying bowel gas.  No intra  or extrahepatic biliary ductal dilation. 2.  Normal appearance of the gallbladder and liver. 3.  Right upper pole renal cyst is simple.  Original Report Authenticated By: Andreas Newport, M.D.     1. Abdominal pain   2. Nausea     3:10 PM I reviewed the CT scan and clinic notes sent from [facility]. In their estimation, it seems to me that his primary concern was fecal retention and constipation. The patient does report that he used multiple laxatives and bowel prep and did have significant stool output did seem to improve his symptoms. He does have an MRI scheduled or plans per him. The recommendations from the CT scan did indicate possibly an MRCP might be indicated. Ultrasound performed here today shows no abnormalities. He reports after IV medications, his pain and nausea much improved. I feel if the patient is able to tolerate by mouth's here, he is stable to be discharged to home. His white count here is normal and his LFTs are unremarkable.  4:19 PM Pt has eaten a lunch, drank coffee and still much improved abd pain.  No further nausea, abd is soft, no guard or rebound here.  Will d./c home and he has MRI scheduled and supposed to follow up with his own GI at [facility].  He can always return if worse.  He agrees, he doesn't want to take narcotics due to  problems with constipation recently.    MDM  Will get labs, treat with IVF's, and IV meds, will try to obtain records and CT scan results from [facility].          Gavin Pound. Kitt Ledet, MD 02/15/12 2595

## 2012-03-08 ENCOUNTER — Ambulatory Visit
Admission: RE | Admit: 2012-03-08 | Discharge: 2012-03-08 | Disposition: A | Discharge status: Home / Self Care | Payer: 59 | Source: Ambulatory Visit | Attending: Radiation Oncology | Admitting: Radiation Oncology

## 2012-03-08 ENCOUNTER — Encounter: Payer: Self-pay | Admitting: Radiation Oncology

## 2012-03-08 VITALS — BP 104/72 | HR 85 | Temp 99.3°F | Resp 18 | Ht 70.0 in | Wt 150.9 lb

## 2012-03-08 DIAGNOSIS — C01 Malignant neoplasm of base of tongue: Secondary | ICD-10-CM | POA: Insufficient documentation

## 2012-03-08 DIAGNOSIS — Z86718 Personal history of other venous thrombosis and embolism: Secondary | ICD-10-CM | POA: Insufficient documentation

## 2012-03-08 NOTE — Progress Notes (Signed)
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 Ciprofloxacin Shortness Of Breath  . Levofloxacin Anaphylaxis  . Moxifloxacin Anaphylaxis    Medications:  Current Outpatient Prescriptions  Medication Sig Dispense Refill  . ALPRAZolam (XANAX) 0.25 MG tablet Take 0.25 mg by mouth daily as needed. For anxiety.      Marland Kitchen amylase-lipase-protease (PANGESTYME CN-20) 66.02-25-74 MU per capsule Take 2 capsules by mouth 3 (three) times daily as needed. To aid in food digestion.      . budesonide (ENTOCORT EC) 3 MG 24 hr capsule Take 3 mg by mouth every morning.        . Cholecalciferol (VITAMIN D-3) 5000 UNITS TABS Take 2 tablets by mouth daily.      . Cyanocobalamin (VITAMIN B 12 PO) Take 2 tablets by mouth daily.      . Flaxseed, Linseed, OIL Take 2 tablets by mouth daily.       Marland Kitchen ibuprofen (ADVIL,MOTRIN) 200 MG tablet Take 600 mg by mouth every 6 (six) hours as needed. PAIN      . OVER THE COUNTER MEDICATION Take 2 tablets by mouth daily. Tumeric      . polyethylene glycol (MIRALAX / GLYCOLAX) packet Take 17 g by mouth daily as needed. For constipation.      . traMADol (ULTRAM) 50 MG tablet Take 50 mg by mouth every 8 (eight) hours as needed. For pain.      . valACYclovir (VALTREX) 500 MG tablet Take 500 mg by mouth daily as needed. For outbreaks.      Marland Kitchen DISCONTD: colesevelam (WELCHOL) 625 MG tablet Take 1,250 mg by mouth at bedtime.        Marland Kitchen DISCONTD: Eszopiclone (ESZOPICLONE) 3 MG TABS Take 3 mg by mouth at bedtime. Take immediately before bedtime       . DISCONTD: gabapentin (NEURONTIN) 250 MG/5ML solution Take 250 mg by mouth at bedtime.          Interval  History: Dionisios presents today for followup.  He call the office and requested this appointment. He saw Dr. Pollyann Kennedy about 6 months ago. He has not been back to see [name].  He was concerned about an episode of abdominal pain which she had about 2 months ago. He describes this as waking up in the middle the night with excruciating pain requiring hospitalization. A CT at that time was negative. Followup imaging including and MRI of the liver was performed which showed a mild diffuse dilatation of the pancreatic duct. The scan performed on 01/24/2012 again showed mildly dilated with stable to a previous study performed in August of 2012. No definitive mass could be seen. As fatty replacement of the pancreas. He does recall that prior to this he had gone off of his Creon and experience a significant amount of constipation. In a few weeks before his onset of abdominal pain he had restarted his Creon. He is scheduled for MRCP in the next few weeks. Regarding his head and neck cancer symptoms these are stable. He continues to have dry mouth with any extensive talking. He continues to have occasional pain in his  neck. Nothing new or worrisome. He admits to no swollen lymph nodes. He is using his fluoride trays and following up regularly with his dentist. He is on chronic steroids for his Crohn's disease which they feel is the best treatment option for him rather than any immunomodulatory agents which could increase his risk of cancer recurrence. His dentist recently diagnosed him with "hairy tongue" which is apparently a superficial mold infection which can occur in patients on steroids who have diminished immune systems and dry mouth. He is just using hydrogen peroxide rinses for this. He has been off her in on a house and is interested in moving to [place]. He is somewhat concerned about leaving his doctors in the [facility] area and about establishing care there if he needs it.  Physical Exam:    height is 5\' 10"  (1.778 m) and weight is 150 lb 14.4 oz (68.448 kg). His oral temperature is 99.3 F (37.4 C). His blood pressure is 104/72 and his pulse is 85. His respiration is 18.  He has been. He is in no distress. He has no palpable cervical or supraglottic or adenopathy. His neck is soft. He again has some tenderness over his left thyroid cartilage. He has no palpable cervical supraclavicular or submandibular adenopathy. He has moderately moist mucous membranes. His trunk protrudes at midline. He has no palpable or visible evidence of disease recurrence in his bilateral tonsillar fossa is or base of tongue. His cranial nerves III through XII are tested and intact.  IMPRESSION: Stage II/III head and neck cancer 2 years out from treatment no evidence of disease  PLAN:  Aron is a 56 y.o. male who is now almost 2 years out from treatments. He has no evidence of disease at this time. He is being followed by his primary care care physician and has had a normal TSH in the past year. His pancreatic symptoms seem to be stabilized and he is pursuing further workup with his gastroenterologist regarding this. I told him I would plan on seeing him back in a year and I would see him yearly until he was 5 years out from treatment. I encouraged him to followup with Dr. Pollyann Kennedy or [name] in the interim. Regarding his concern about care and [facility] I assured him that we would get him into excellent physicians if necessary. I encouraged him to contact me with any questions or concerns. He agreed to do so.    Lurline Hare, MD

## 2012-03-08 NOTE — Progress Notes (Signed)
States he is here today for multiple issues.  Has been 2 years since h/n cancer but has some neck pain, wants to make sure everything is ok.  Also has the problem with pancreas, says he is scheduled may 29th for test of pancreas... No pain now.  No problems with eating now but did lose a lot of wt during pancreatitis.  Has gained wt back but not to normal wt of 172.  Wt now is 150 lb.  Wants to know if pancreatitis came from throat ca and tx

## 2012-04-07 NOTE — Progress Notes (Signed)
Encounter addended by: Delynn Flavin, RN on: 04/07/2012  6:33 PM<BR>     Documentation filed: Charges VN

## 2012-08-07 ENCOUNTER — Telehealth: Payer: Self-pay | Admitting: Radiation Oncology

## 2012-08-07 NOTE — Telephone Encounter (Signed)
Phoned patient at home number listed in demographics. No answer and no option to leave message. Phoned work number needing to inquire about outcome of ENT visit. Patient reports that his ENT scoped him and saw nothing. Also, patient goes on to say that he has experience no bleeding since Tuesday of last week. Also, patient reports that he was told by his ENT he saw surface blood vessels that have healed. Patient reports he was encourage to gargle with cold water if the bleeding presented again and if that didn't stop it to present to the ED. Patient reports he is now fighting a "bad cold." Encouraged patient to call/contact staff with future needs. Patient verbalized understanding.

## 2013-03-05 ENCOUNTER — Encounter: Payer: Self-pay | Admitting: Radiation Oncology

## 2013-03-05 DIAGNOSIS — Z923 Personal history of irradiation: Secondary | ICD-10-CM | POA: Insufficient documentation

## 2013-03-08 ENCOUNTER — Ambulatory Visit: Payer: 59 | Attending: Radiation Oncology | Admitting: Radiation Oncology

## 2013-03-08 ENCOUNTER — Ambulatory Visit: Payer: 59 | Admitting: Radiation Oncology

## 2013-03-09 ENCOUNTER — Encounter: Payer: Self-pay | Admitting: *Deleted

## 2013-03-22 ENCOUNTER — Ambulatory Visit
Admission: RE | Admit: 2013-03-22 | Discharge: 2013-03-22 | Disposition: A | Discharge status: Home / Self Care | Payer: 59 | Source: Ambulatory Visit | Attending: Radiation Oncology | Admitting: Radiation Oncology

## 2013-03-22 ENCOUNTER — Encounter: Payer: Self-pay | Admitting: Radiation Oncology

## 2013-03-22 VITALS — BP 119/74 | HR 63 | Temp 97.5°F | Resp 20 | Wt 158.5 lb

## 2013-03-22 DIAGNOSIS — C01 Malignant neoplasm of base of tongue: Secondary | ICD-10-CM

## 2013-03-22 NOTE — Progress Notes (Signed)
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  He is now almost 3 years out from treatment. He continues to complain of neuropathy. He describes this as worsening recently. About 2 weeks ago he had episodes where he was dropping things from his left hand and also complaints of having poor coordination with his right hand as well. He also states that if he rests his elbow on his car window for too long a time his hand goes to sleep. He has noticed a numb feeling in his legs when he cycles or exercises for extended periods of time. He has spoken with his primary physician about this and she is recommended at least an x-ray of the spine. He still thinking about it now. He has not had any further bleeding episodes. He has seen an orthodontist and would like to pursue either braces or a retained her to help with his front teeth which he feels are moving closer together and causing him discomfort. He is also very interested in seeing a therapist due to some anger issues he has been dealing with since his diagnosis. He is also noted that he does not sweat when working out. He is accompanied by his girlfriend who is also a cancer survivor. He has not seen [name]. He has had no further abdominal pain.   Allergies:  Allergies  Allergen Reactions  . Ciprofloxacin Shortness Of Breath  . Levofloxacin Anaphylaxis  . Moxifloxacin Anaphylaxis     Medications:  Current Outpatient Prescriptions  Medication Sig Dispense Refill  . amylase-lipase-protease (PANGESTYME CN-20) 66.02-25-74 MU per capsule Take 2 capsules by mouth 3 (three) times daily as needed. To aid in food digestion.      . budesonide (ENTOCORT EC) 3 MG 24 hr capsule Take 3 mg by mouth every morning.        Marland Kitchen CREON 24000 UNITS CPEP Take by mouth 3 (three) times daily with meals.      . Cyanocobalamin (VITAMIN B 12 PO) Take 2 tablets by mouth daily.      . Flaxseed, Linseed, OIL Take 2 tablets by mouth daily.       Marland Kitchen ibuprofen (ADVIL,MOTRIN) 200 MG tablet Take 600 mg by mouth every 6 (six) hours as needed. PAIN      . OVER THE COUNTER MEDICATION Take 2 tablets by mouth daily. Tumeric      . traMADol (ULTRAM) 50 MG tablet Take 50 mg by mouth every 8 (eight) hours as needed. For pain.      . valACYclovir (VALTREX) 500 MG tablet Take 500 mg by mouth daily as needed. For outbreaks.      . [DISCONTINUED] colesevelam (WELCHOL) 625 MG tablet Take 1,250 mg by mouth at bedtime.        . [DISCONTINUED] Eszopiclone (ESZOPICLONE) 3 MG TABS Take 3 mg by mouth at bedtime. Take immediately before bedtime       . [DISCONTINUED] gabapentin (NEURONTIN) 250 MG/5ML solution Take 250 mg  by mouth at bedtime.         No current facility-administered medications for this encounter.    Physical Exam:  Filed Vitals:   03/22/13 1545  BP: 119/74  Pulse: 63  Temp: 97.5 F (36.4 C)  Resp: 20   Wt Readings from Last 3 Encounters:  03/22/13 158 lb 8 oz (71.895 kg)  03/08/12 150 lb 14.4 oz (68.448 kg)  06/16/11 149 lb 11.2 oz (67.903 kg)   he is a pleasant male who is alert and oriented sitting comfortably in examining room chair. He has no palpable cervical or supraclavicular adenopathy. No submandibular or submental adenopathy. Examination of his oral cavity shows no thrush or lesions. Examination of the left and right tonsillar fossa as well as the base of tongue with indirect mirror  examination shows no evidence of visible disease. There is also no palpable evidence of disease in the left or right tonsillar fossa.   IMPRESSION:  Anthony Washington is a 57 y.o. male status post neoadjuvant chemotherapy followed by radiation and Erbitux with no evidence of disease and continuing side effects from treatment  PLAN:  I will see him back in one year. I have referred him to dental medicine for evaluation and their opinion regarding orthodontic reticulation after radiation. I unfortunately do not have his radiation records. I will also contact our chaplain who worked with Mr. Brumbach and our survivorship class for an appropriate therapist referral. He has agreed to contact [name] and schedule followup with her. I've encouraged him to pursue an MRI of the spine to investigate the symptoms of motor discoordination. It would be unlikely an unusual for this to be a side effect of his treatment and other explanations for this should be pursued before assuming this is a residual side effect of his treatment.    Lurline Hare, MD

## 2013-03-22 NOTE — Progress Notes (Signed)
Follow up  S/p rad tx left tongue completed 07/03/10, 70.2GY  Alert,oriented x3, steady gait, no saliva, teeth have shifted,  Needs letter statsing he can get either braces or retainer from Oncologist , no jaw pain, has  nerve/muscle pain left side neck at times, , has neuropathy mostly left hand and feet, s/p chemotherapy, iin [facility],, sometimes swallowing difficulty,has good appetite though, drops things left hand, un coordination,  Dry mouth, no sob, no nausea, feels he needs referral for Pyschiatrist  3:55 PM

## 2013-03-27 ENCOUNTER — Telehealth: Payer: Self-pay | Admitting: *Deleted

## 2013-03-27 NOTE — Telephone Encounter (Signed)
Called patient home [phone] with Mr. Ben, per Dr.Wentworth message , that after speaking with Dr.Kuliski his suggestion was to have MR. Teacher, early years/pre or Orthodontist to contact a Dr.Lauren Armed forces training and education officer in [place]to obtain her opinion on getting a retainer or braces , Dr.Patten has Oral Medicine training and may provide an opinion or refer to an  Orthodontic Faculty who can provide an opinion on this, Mr. kailan laws MD  For this information and this Rn for the call back, will see what he can do 9:26 AM

## 2013-03-28 ENCOUNTER — Other Ambulatory Visit: Payer: Self-pay | Admitting: Radiation Oncology

## 2013-03-28 DIAGNOSIS — C01 Malignant neoplasm of base of tongue: Secondary | ICD-10-CM

## 2013-11-12 IMAGING — CR DG ABDOMEN ACUTE W/ 1V CHEST
3 series · 3 of 3 positions shown · non-contrast
Comparison: None.

CLINICAL DATA: Acute abdominal pain

ACUTE ABDOMEN SERIES (ABDOMEN 2 VIEW & CHEST 1 VIEW)

[w chest pa]
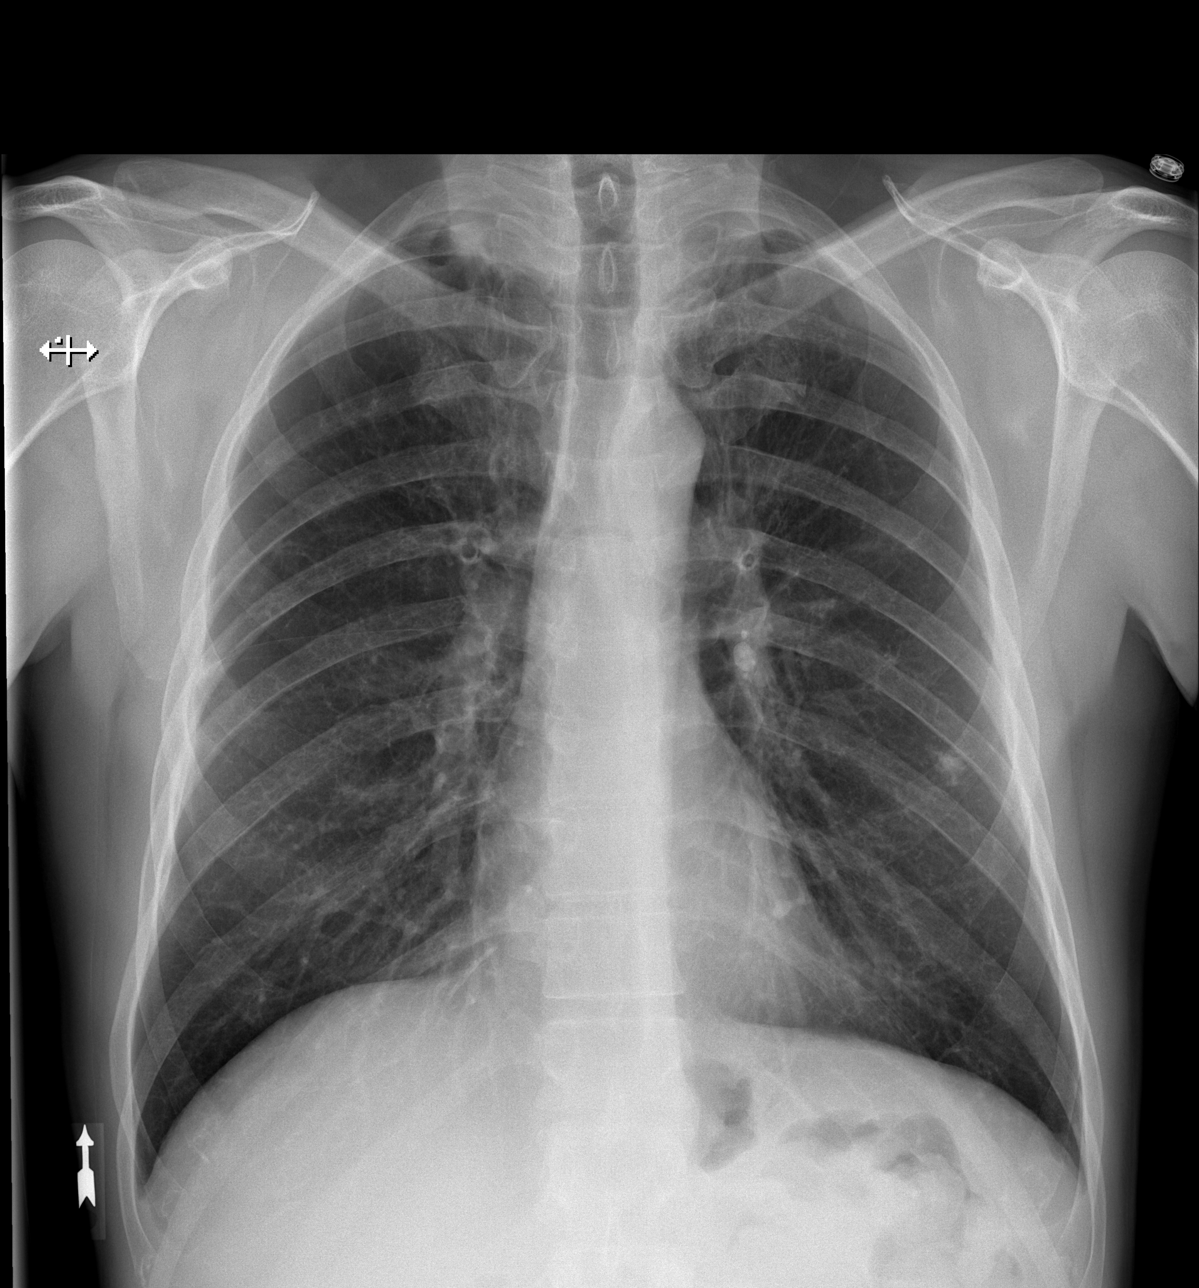

[w abdomen upright]
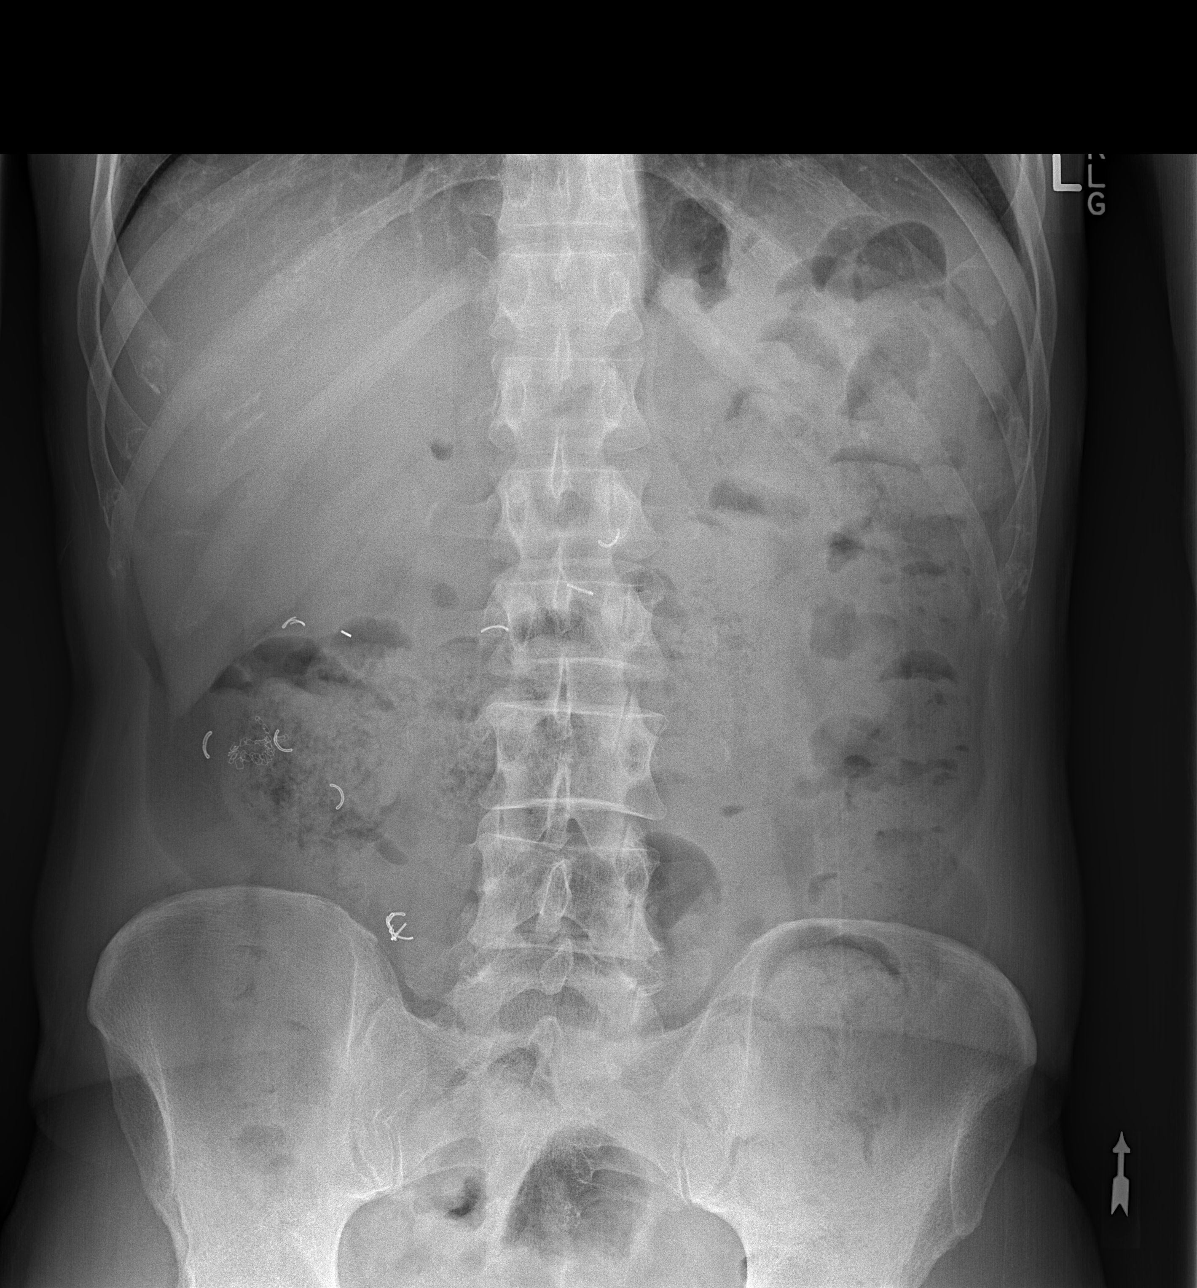

[t abdomen supine]
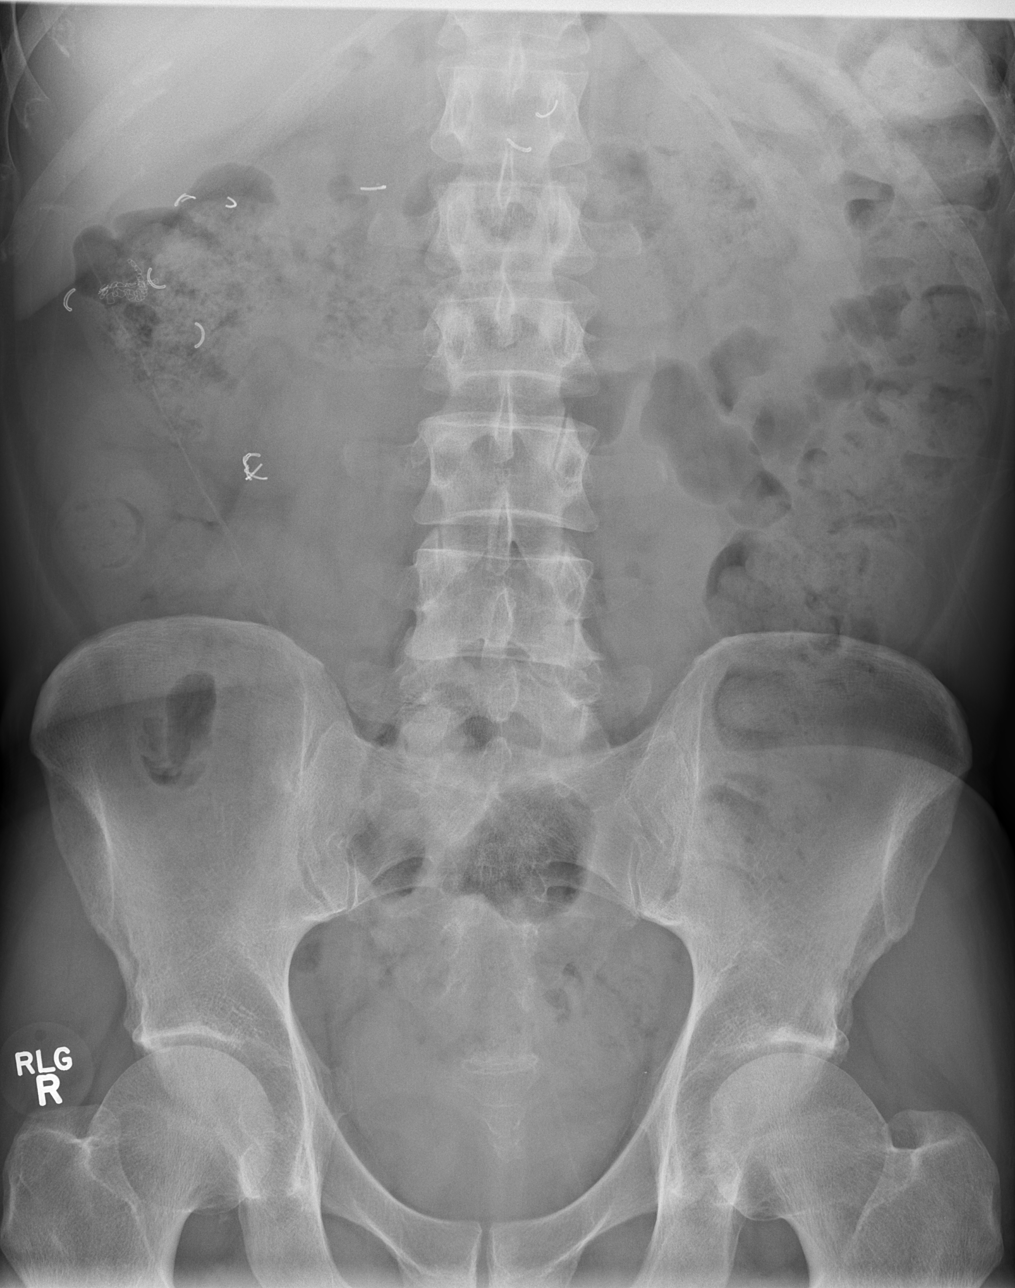

[3 of 3 positions shown; findings below may reference images not displayed]

FINDINGS: Normal cardiac silhouette.  There is nodular density in
the left mid lung measuring 11 mm.  There this may be partially
calcified.  No comparison.

No free air beneath hemidiaphragms.  There is moderate volume stool
throughout the colon.  There is gas in the rectum as well as stool.
No dilated loops of small bowel.  Multiple surgical clips in the
abdomen.
IMPRESSION: 1..  No acute cardiopulmonary process.
2.  No evidence of bowel obstruction intraperitoneal free air.
3.  Moderate volume stool throughout the colon suggests
constipation.
4.  Nodule in the left lung.  Recommend noncontrast CT for
evaluation.

## 2013-12-05 IMAGING — US US ABDOMEN COMPLETE
1 series · 14 of 25 positions shown · non-contrast
Comparison: Acute abdominal series 01/23/2012.

CLINICAL DATA: Pancreatitis.

COMPLETE ABDOMINAL ULTRASOUND

[Series 1: us abdomen complete · 80 acquisitions, 14 frames shown]
[im 1/80]
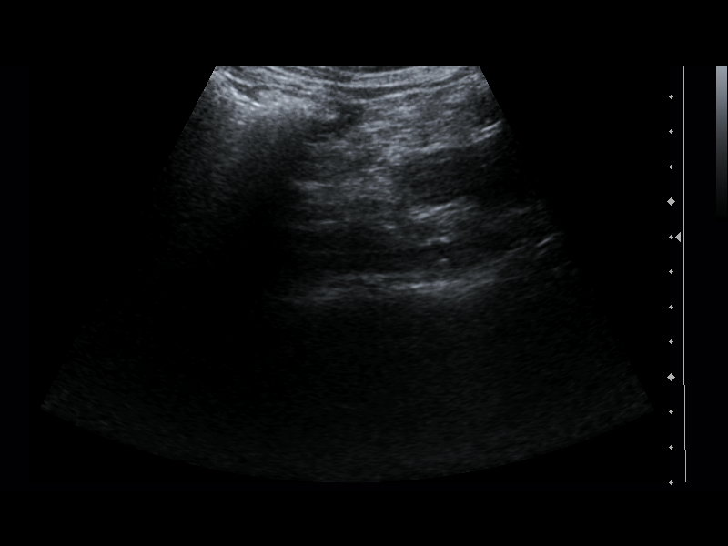
[im 7/80]
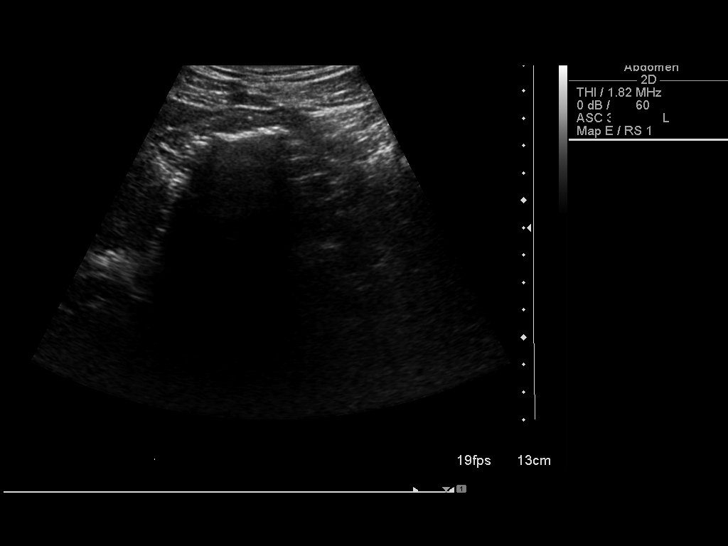
[im 14/80]
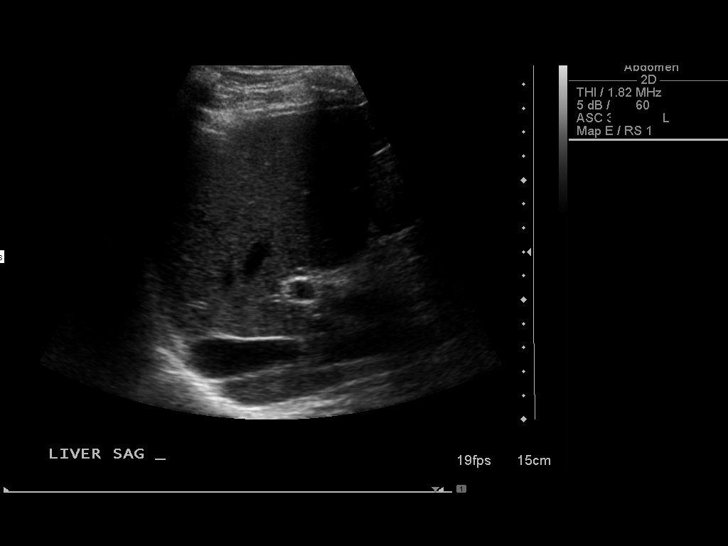
[im 20/80]
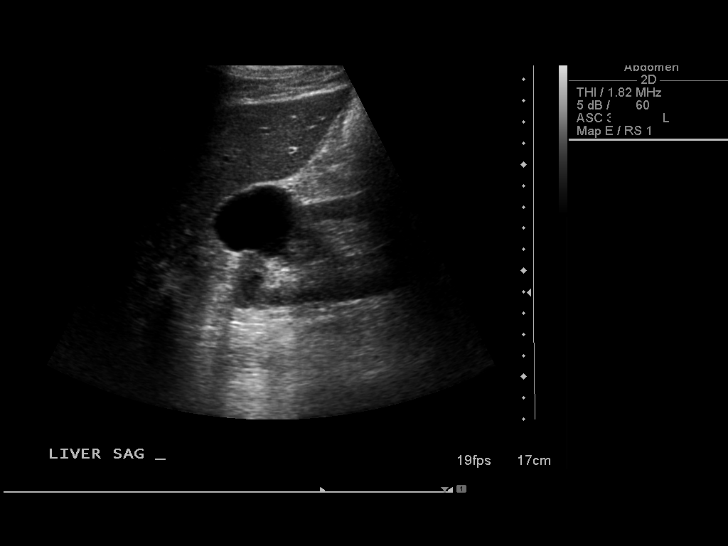
[im 27/80]
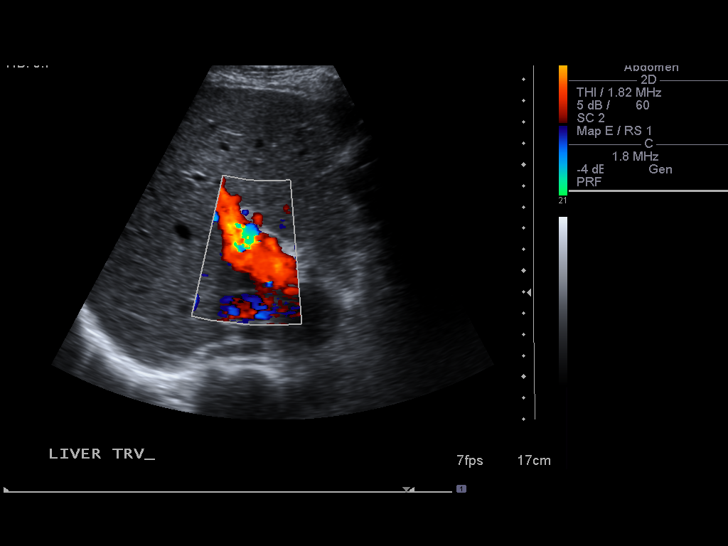
[im 30/80]
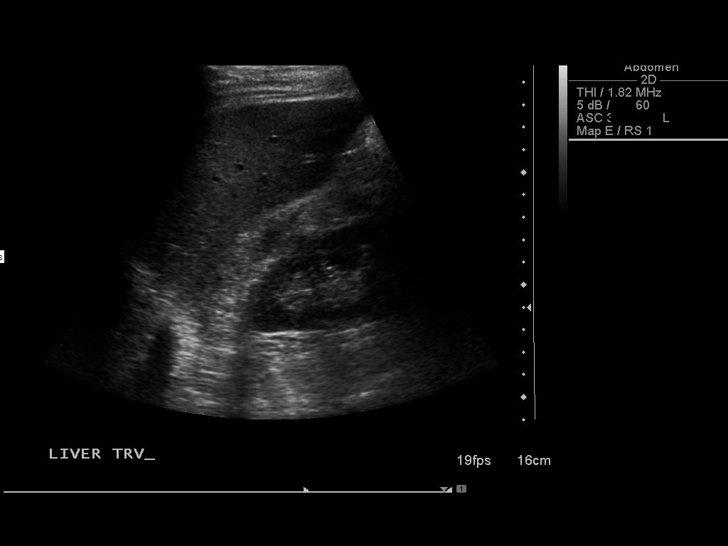
[im 37/80]
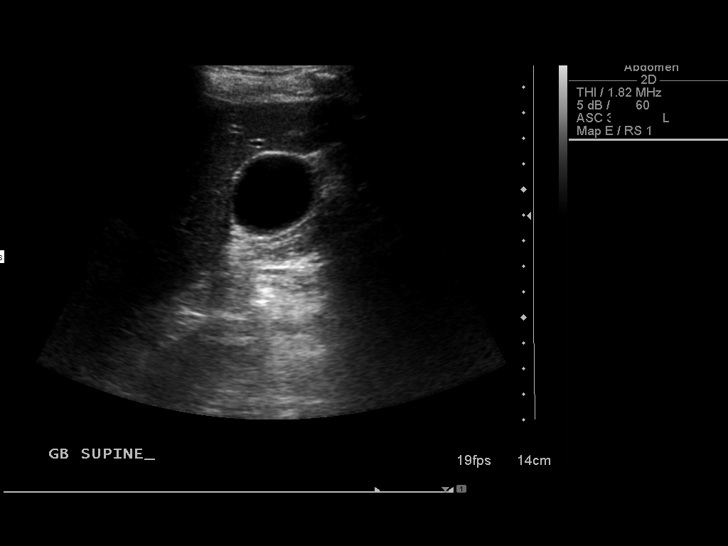
[im 43/80]
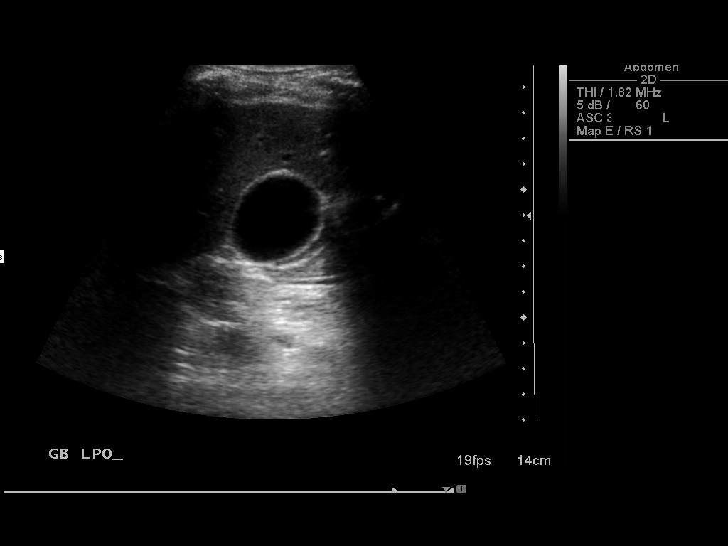
[im 50/80]
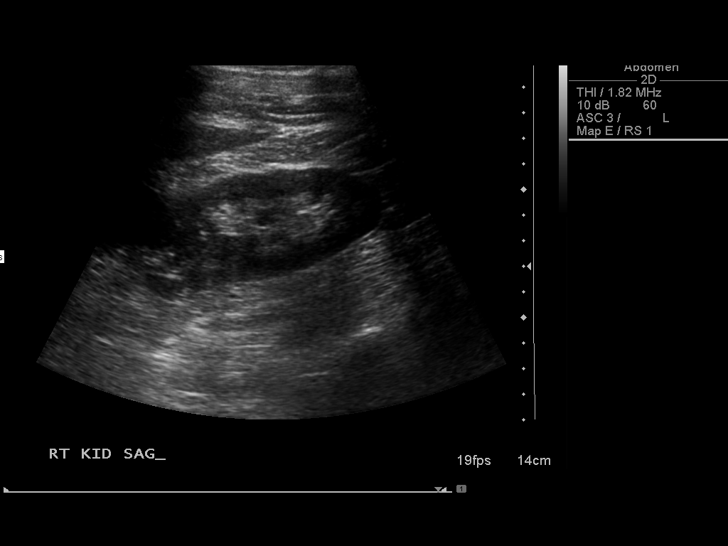
[im 53/80]
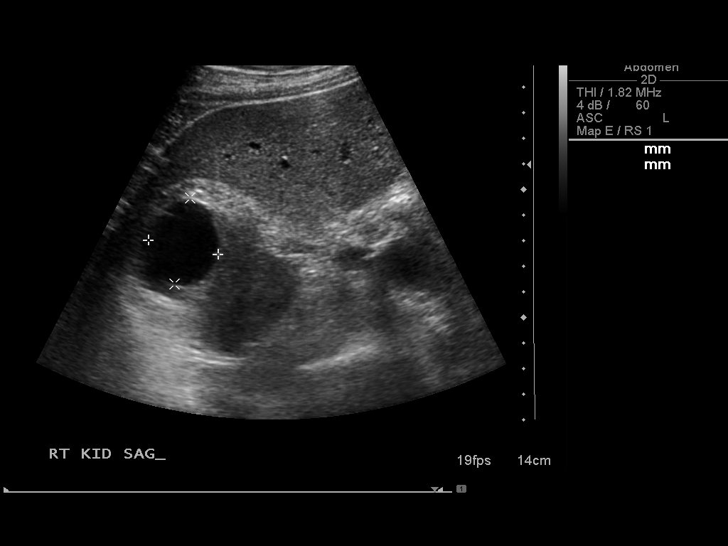
[im 60/80]
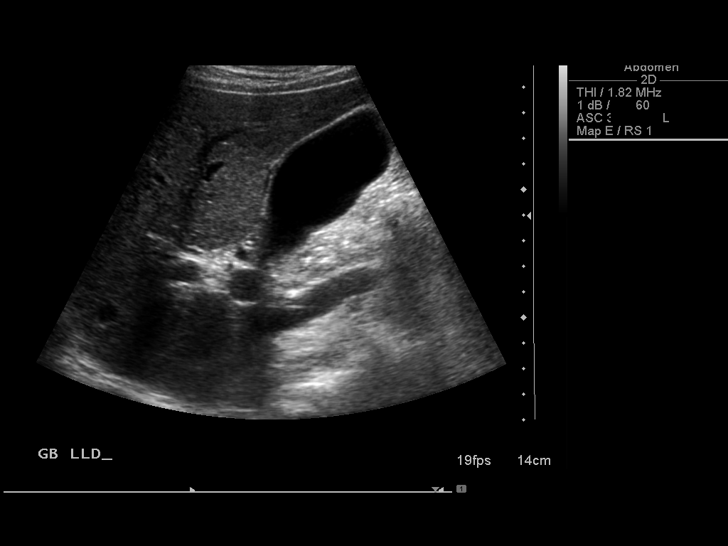
[im 66/80]
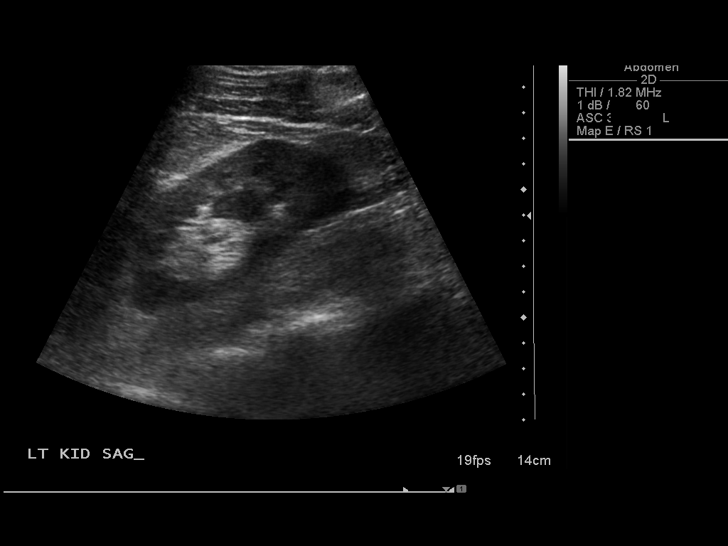
[im 73/80]
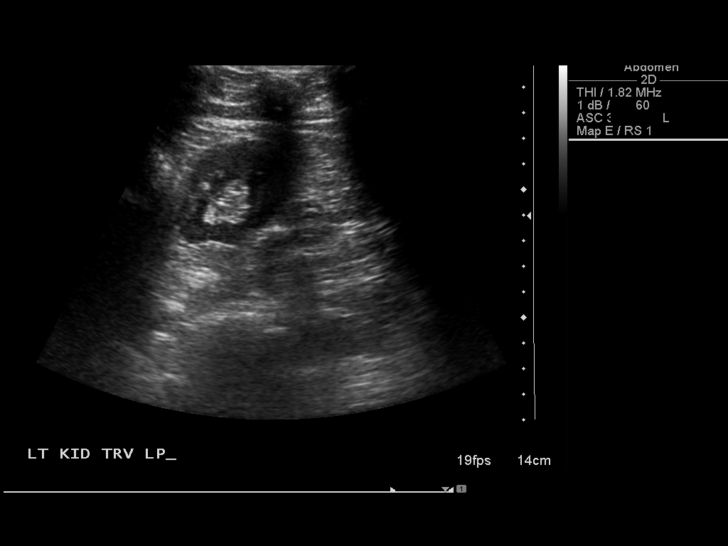
[im 80/80]
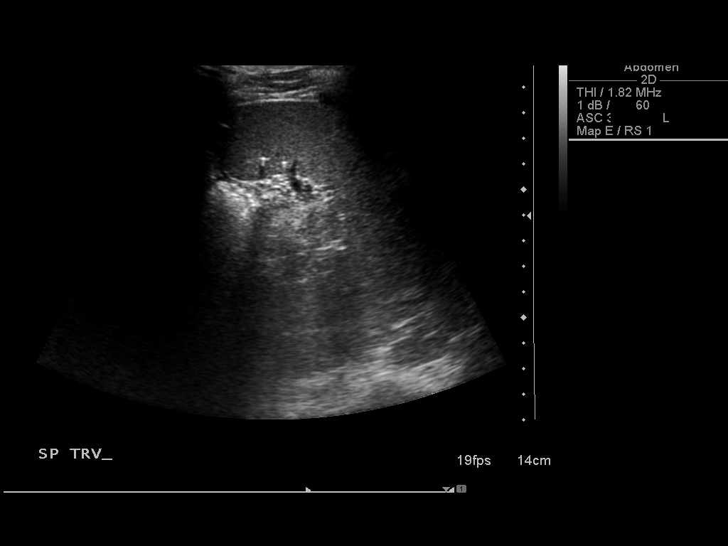

[14 of 25 positions shown; findings below may reference images not displayed]

FINDINGS: Gallbladder:  Normal appearance of the gallbladder.  No stones,
sludge, pericholecystic fluid, or wall thickening.  Gallbladder
wall measures under 3 mm, normal.  No sonographic Murphy's sign.

Common bile duct:  4 mm.

Liver:  Normal hepatic echotexture.  No mass lesions.

IVC:  Appears normal.

Pancreas:  Obscured by overlying bowel gas.

Spleen:  8.2 cm.  Normal echotexture.

Right Kidney:  Right upper renal pole simple cyst measuring 28 mm x
34 mm x 32 mm.  Normal central sinus echo complex.

Left Kidney:  11.6 cm. Normal echotexture.  Normal central sinus
echo complex.  No calculi or hydronephrosis.

Abdominal aorta:  No aneurysm identified.
IMPRESSION: 1.  Pancreas obscured by overlying bowel gas.  No intra or
extrahepatic biliary ductal dilation.
2.  Normal appearance of the gallbladder and liver.
3.  Right upper pole renal cyst is simple.

## 2014-02-14 ENCOUNTER — Ambulatory Visit
Admission: RE | Admit: 2014-02-14 | Discharge: 2014-02-14 | Disposition: A | Discharge status: Home / Self Care | Payer: 59 | Source: Ambulatory Visit | Attending: Radiation Oncology | Admitting: Radiation Oncology

## 2014-02-14 VITALS — BP 120/77 | HR 70 | Temp 98.4°F | Wt 170.1 lb

## 2014-02-14 DIAGNOSIS — C01 Malignant neoplasm of base of tongue: Secondary | ICD-10-CM

## 2014-02-14 NOTE — Progress Notes (Signed)
Patient for routine follow up completion of radiation august 2011.No hospitalizations.Patient states he has progressively worse posterior neck pain over the last 2 months extending into mid shoulder and soft tissue, muscle of bilateral neck.also states he has been having headaches.Last TSH was about 1 to 1.5 years ago and is due for physical soon.Seen by Heywood Footman( ENT)  and scoped in March 2015.I have called for notes to be faxed.

## 2014-02-15 NOTE — Progress Notes (Signed)
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  He is now almost 4 years out from treatment. He is complaining again of neck pain. He did not pursue any further workup of this last year. He states this is now mostly constant. He has appointment with his family practice physician next week and plans to bring up with her. He saw Dr. Laurance Flatten last week and was scoped. He states he had no evidence of recurrence. He occasionally coughs up blood when his throat becomes very dry especially at night. He has occasional throat pain. Again he states the scope was negative by his physician last week. He reports continued dry mouth. No swelling. The nerve symptoms in his hands have improved. He also complains of soreness in his quadriceps. He continues on steroids for his inflammatory bowel disease.  Allergies:  Allergies  Allergen Reactions  . Ciprofloxacin Shortness Of Breath  . Levofloxacin Anaphylaxis  . Moxifloxacin Anaphylaxis    Medications:  Current Outpatient Prescriptions  Medication Sig Dispense Refill  . budesonide (ENTOCORT EC) 3 MG 24 hr capsule Take 3 mg by mouth every morning.        Marland Kitchen CREON 24000 UNITS CPEP Take by mouth 3 (three) times daily with meals. 3 capsules three times daily      . Cyanocobalamin (VITAMIN B 12 PO) Take 2 tablets by mouth daily.      . Flaxseed, Linseed, OIL Take 2 tablets by mouth daily.       Marland Kitchen ibuprofen  (ADVIL,MOTRIN) 200 MG tablet Take 600 mg by mouth every 6 (six) hours as needed. PAIN      . traMADol (ULTRAM) 50 MG tablet Take 50 mg by mouth every 8 (eight) hours as needed. For pain.      . valACYclovir (VALTREX) 500 MG tablet Take 500 mg by mouth daily as needed. For outbreaks.      Marland Kitchen OVER THE COUNTER MEDICATION Take 2 tablets by mouth daily. Tumeric      . [DISCONTINUED] colesevelam (WELCHOL) 625 MG tablet Take 1,250 mg by mouth at bedtime.        . [DISCONTINUED] Eszopiclone (ESZOPICLONE) 3 MG TABS Take 3 mg by mouth at bedtime. Take immediately before bedtime       . [DISCONTINUED] gabapentin (NEURONTIN) 250 MG/5ML solution Take 250 mg by mouth at bedtime.         No current facility-administered medications for this encounter.    Physical Exam:  Filed Vitals:   02/14/14 1428  BP: 120/77  Pulse: 70  Temp: 98.4 F (36.9 C)   Wt Readings from Last 3 Encounters:  02/14/14 170 lb 1.6 oz (77.157 kg)  03/22/13 158 lb 8 oz (71.895 kg)  03/08/12 150 lb 14.4 oz (68.448 kg)   he is a pleasant male who is alert and oriented sitting comfortably in examining room chair. He has no palpable cervical or supraclavicular adenopathy. No submandibular or submental adenopathy. Examination of his oral cavity shows  no thrush or lesions. Examination of the left and right tonsillar fossa as well as the base of tongue with indirect mirror examination shows no evidence of visible disease. There is also no palpable evidence of disease in the left or right tonsillar fossa or base of tongue. He has 5 out of 5 strength in his bilateral interosseous muscles and grip.   IMPRESSION:  Shubh is a 58 y.o. male status post neoadjuvant chemotherapy followed by radiation and Erbitux with no evidence of disease and continuing side effects from treatment   PLAN: I will see him back in a year. I encouraged him to continue followup with his ENT. I encouraged him to continue followup with his primary physician regarding his  neck pain. His quadriceps pain is likely from chronic steroid use but again asked him to consider asking his primary care physician that as well. We discussed regular followup with his dentist. If all looks good next year he will be able to be released from followup.   Thea Silversmith, MD

## 2014-05-28 DIAGNOSIS — K5000 Crohn's disease of small intestine without complications: Secondary | ICD-10-CM | POA: Insufficient documentation

## 2014-06-13 ENCOUNTER — Telehealth: Payer: Self-pay

## 2014-06-13 NOTE — Telephone Encounter (Signed)
Patient called on 06/11/14 . to inquire if there were any other urologist besides [facility] in [place] as his PSA is elevated at 8.5 and there are no new patient slots until the end of August or first of September.I did call and verified this and on calling patient back there was a cancellation so they will be able to see him today at 3:00 pm.

## 2014-06-22 ENCOUNTER — Encounter (HOSPITAL_BASED_OUTPATIENT_CLINIC_OR_DEPARTMENT_OTHER): Payer: Self-pay | Admitting: Emergency Medicine

## 2014-06-22 ENCOUNTER — Emergency Department (HOSPITAL_BASED_OUTPATIENT_CLINIC_OR_DEPARTMENT_OTHER)
Admission: EM | Admit: 2014-06-22 | Discharge: 2014-06-23 | Disposition: A | Discharge status: Home / Self Care | Payer: 59 | Attending: Emergency Medicine | Admitting: Emergency Medicine

## 2014-06-22 DIAGNOSIS — Z791 Long term (current) use of non-steroidal anti-inflammatories (NSAID): Secondary | ICD-10-CM | POA: Diagnosis not present

## 2014-06-22 DIAGNOSIS — Z8701 Personal history of pneumonia (recurrent): Secondary | ICD-10-CM | POA: Diagnosis not present

## 2014-06-22 DIAGNOSIS — IMO0002 Reserved for concepts with insufficient information to code with codable children: Secondary | ICD-10-CM | POA: Insufficient documentation

## 2014-06-22 DIAGNOSIS — R102 Pelvic and perineal pain: Secondary | ICD-10-CM

## 2014-06-22 DIAGNOSIS — R109 Unspecified abdominal pain: Secondary | ICD-10-CM | POA: Diagnosis not present

## 2014-06-22 DIAGNOSIS — R300 Dysuria: Secondary | ICD-10-CM | POA: Diagnosis present

## 2014-06-22 DIAGNOSIS — Z85810 Personal history of malignant neoplasm of tongue: Secondary | ICD-10-CM | POA: Insufficient documentation

## 2014-06-22 DIAGNOSIS — Z79899 Other long term (current) drug therapy: Secondary | ICD-10-CM | POA: Insufficient documentation

## 2014-06-22 DIAGNOSIS — K5090 Crohn's disease, unspecified, without complications: Secondary | ICD-10-CM | POA: Insufficient documentation

## 2014-06-22 DIAGNOSIS — Z87891 Personal history of nicotine dependence: Secondary | ICD-10-CM | POA: Insufficient documentation

## 2014-06-22 DIAGNOSIS — Z86718 Personal history of other venous thrombosis and embolism: Secondary | ICD-10-CM | POA: Diagnosis not present

## 2014-06-22 DIAGNOSIS — Z923 Personal history of irradiation: Secondary | ICD-10-CM | POA: Diagnosis not present

## 2014-06-22 LAB — URINALYSIS, ROUTINE W REFLEX MICROSCOPIC
Bilirubin Urine: NEGATIVE
Glucose, UA: NEGATIVE mg/dL
HGB URINE DIPSTICK: NEGATIVE
Ketones, ur: NEGATIVE mg/dL
Leukocytes, UA: NEGATIVE
NITRITE: NEGATIVE
PH: 5 (ref 5.0–8.0)
Protein, ur: NEGATIVE mg/dL
SPECIFIC GRAVITY, URINE: 1.027 (ref 1.005–1.030)
UROBILINOGEN UA: 0.2 mg/dL (ref 0.0–1.0)

## 2014-06-22 MED ORDER — NAPROXEN 500 MG PO TABS: 500.0000 mg | ORAL_TABLET | Freq: Two times a day (BID) | ORAL | Status: DC

## 2014-06-22 NOTE — ED Provider Notes (Signed)
CSN: [id number]     Arrival date & time 06/22/14  2218 History  This chart was scribed for Amandalee Lacap Alfonso Patten, MD by Randa Evens, ED Scribe. This patient was seen in room MH02/MH02 and the patient's care was started at 11:06 PM.     Chief Complaint  Patient presents with  . Dysuria   Patient is a 58 y.o. male presenting with dysuria. The history is provided by the patient. No language interpreter was used.  Dysuria This is a recurrent problem. The current episode started more than 2 days ago. The problem occurs constantly. The problem has not changed since onset.Pertinent negatives include no chest pain, no abdominal pain, no headaches and no shortness of breath. Nothing aggravates the symptoms. Nothing relieves the symptoms. Treatments tried: NSAIDs. The treatment provided no relief.   HPI Comments: Anthony Washington is a 58 y.o. male who presents to the Emergency Department complaining of Dysuria onset 3 days prior. He states that he has been having difficulty urinating due to the pain. He sates that his urologist suggested that he come to see if it shows as a prostate infection. He states that he has an appointment with his urologist in 2 weeks. He states he has been taking ibuprofen with temporary relief.  Told to come in by Dr. Junious Silk  Past Medical History  Diagnosis Date  . Borderline diabetes   . Carcinoma of base of tongue 2011    HPV positive; s/p chemoradiation  . Crohn disease   . Hx of radiation therapy     completed 07/03/2010, concurrent chemo  . DVT (deep venous thrombosis)     history, right shoulder  . Pneumonia     hx of  . Insomnia    Past Surgical History  Procedure Laterality Date  . Port acath placement    . Lymphectomy    . Peg tube placement     History reviewed. No pertinent family history. History  Substance Use Topics  . Smoking status: Former Smoker -- 1.00 packs/day for 7 years    Quit date: 03/05/1986  . Smokeless tobacco: Not on file  .  Alcohol Use: Yes     Comment: occasional    Review of Systems  Constitutional: Negative for fever.  Respiratory: Negative for shortness of breath.   Cardiovascular: Negative for chest pain.  Gastrointestinal: Negative for abdominal pain.  Genitourinary: Positive for dysuria. Negative for frequency, hematuria and discharge.  Neurological: Negative for headaches.  All other systems reviewed and are negative.   Allergies  Ciprofloxacin; Levofloxacin; and Moxifloxacin  Home Medications   Prior to Admission medications   Medication Sig Start Date End Date Taking? Authorizing Provider  CREON 24000 UNITS CPEP Take by mouth 3 (three) times daily with meals. 3 capsules three times daily 02/28/13  Yes Historical Provider, MD  traMADol (ULTRAM) 50 MG tablet Take 50 mg by mouth every 8 (eight) hours as needed. For pain.   Yes Historical Provider, MD  valACYclovir (VALTREX) 500 MG tablet Take 500 mg by mouth daily as needed. For outbreaks.   Yes Historical Provider, MD  budesonide (ENTOCORT EC) 3 MG 24 hr capsule Take 3 mg by mouth every morning.      Historical Provider, MD  Cyanocobalamin (VITAMIN B 12 PO) Take 2 tablets by mouth daily.    Historical Provider, MD  Flaxseed, Linseed, OIL Take 2 tablets by mouth daily.     Historical Provider, MD  ibuprofen (ADVIL,MOTRIN) 200 MG tablet Take 600 mg by mouth  every 6 (six) hours as needed. PAIN    Historical Provider, MD  OVER THE COUNTER MEDICATION Take 2 tablets by mouth daily. Tumeric    Historical Provider, MD   Triage Vitals: BP 109/67  Pulse 75  Temp(Src) 98.3 F (36.8 C)  Resp 18  Wt 165 lb (74.844 kg)  SpO2 98%  Physical Exam  Nursing note and vitals reviewed. Constitutional: He is oriented to person, place, and time. He appears well-developed and well-nourished. No distress.  HENT:  Head: Normocephalic and atraumatic.  Mouth/Throat: Oropharynx is clear and moist.  Eyes: Conjunctivae and EOM are normal. Pupils are equal, round, and  reactive to light.  Neck: Normal range of motion. Neck supple.  Cardiovascular: Normal rate, regular rhythm and normal heart sounds.   Pulmonary/Chest: Effort normal. No respiratory distress. He has no wheezes. He has no rales.  Abdominal: Soft. Bowel sounds are normal. There is no tenderness. There is no rebound and no guarding.  Musculoskeletal: Normal range of motion. He exhibits no edema.  Neurological: He is alert and oriented to person, place, and time. He has normal reflexes.  Skin: Skin is warm and dry.  Psychiatric: He has a normal mood and affect. His behavior is normal.    ED Course  Procedures (including critical care time) DIAGNOSTIC STUDIES: Oxygen Saturation is 98% on RA, normal by my interpretation.    COORDINATION OF CARE:    Labs Review Labs Reviewed  URINALYSIS, ROUTINE W REFLEX MICROSCOPIC    Imaging Review No results found.   EKG Interpretation None      MDM   Final diagnoses:  None    Will prescribe naproxen have recommended pyridium, no contact lens wearing and follow up for further testing with Dr. Junious Silk.  Patient refuses RX for antibiotics as no infection seen at this time   I personally performed the services described in this documentation, which was scribed in my presence. The recorded information has been reviewed and is accurate.       Carlisle Beers, MD 06/23/14 [phone]

## 2014-06-22 NOTE — ED Notes (Signed)
Has prostate issues per patient. Difficulty to urinate for past couple days.

## 2014-06-23 ENCOUNTER — Encounter (HOSPITAL_BASED_OUTPATIENT_CLINIC_OR_DEPARTMENT_OTHER): Payer: Self-pay | Admitting: Emergency Medicine

## 2014-06-23 NOTE — ED Notes (Signed)
Pt discharged prior to my assessing  Them.

## 2014-08-08 DIAGNOSIS — Z923 Personal history of irradiation: Secondary | ICD-10-CM | POA: Insufficient documentation

## 2014-08-08 DIAGNOSIS — C01 Malignant neoplasm of base of tongue: Secondary | ICD-10-CM | POA: Insufficient documentation

## 2014-08-23 HISTORY — PX: PROSTATE BIOPSY: SHX241

## 2014-09-25 ENCOUNTER — Encounter: Payer: Self-pay | Admitting: Radiation Oncology

## 2014-09-25 NOTE — Progress Notes (Signed)
GU Location of Tumor / Histology: prostate cancer  If Prostate Cancer, Gleason Score is (gleason 3+3= 6 in 3 of 12 cores) and PSA is (5.83 on 08/08/14) and prostate volume was 42 cc.  Tresa Endo presented with a rising PSA.  Biopsies revealed: Please see chart.  Past/Anticipated interventions by urology, if any: prostate biopsy done on 08/23/14.    Past/Anticipated interventions by medical oncology, if any: no  Weight changes, if any: no  Bowel/Bladder complaints, if any: yes mainly urgency with urination. Patient has chron's disease.  Nausea/Vomiting, if any: no  Pain issues, if any:  Yes - Patient has neck pain from tongue cancer.  SAFETY ISSUES:  Prior radiation? Yes 70.2 gray to left base of tongue completed 07/03/2010 in [place].  Pacemaker/ICD? no  Possible current pregnancy? no  Is the patient on methotrexate? no  Current Complaints / other details:  IPSS score of 7.

## 2014-09-26 ENCOUNTER — Encounter: Payer: Self-pay | Admitting: Radiation Oncology

## 2014-09-26 ENCOUNTER — Ambulatory Visit
Admission: RE | Admit: 2014-09-26 | Discharge: 2014-09-26 | Disposition: A | Discharge status: Home / Self Care | Payer: 59 | Source: Ambulatory Visit | Attending: Radiation Oncology | Admitting: Radiation Oncology

## 2014-09-26 VITALS — BP 148/98 | HR 86 | Temp 98.6°F | Resp 16 | Ht 70.0 in | Wt 158.3 lb

## 2014-09-26 DIAGNOSIS — C61 Malignant neoplasm of prostate: Secondary | ICD-10-CM | POA: Insufficient documentation

## 2014-09-26 DIAGNOSIS — Z510 Encounter for antineoplastic radiation therapy: Secondary | ICD-10-CM | POA: Insufficient documentation

## 2014-09-26 HISTORY — DX: Malignant neoplasm of prostate: C61

## 2014-09-26 NOTE — Progress Notes (Signed)
Radiation Oncology         [phone] ________________________________  Initial outpatient Consultation  Name: Anthony Washington MRN: [id number]  Date: 09/26/2014  DOB: [birth year]  MS:[id number],CEYE Jeani Hawking, MD  Gwendel Hanson*   REFERRING PHYSICIAN: Gwendel Hanson*  DIAGNOSIS: Stage T1c, Gleason's 6 (3+3) adenocarcinoma the prostate, PSA 5.83   HISTORY OF PRESENT ILLNESS::Anthony Washington is a 58 y.o. male who is seen out courtesy of Dr. Ihor Dow for an opinion concerning radiation therapy as part of management of patient's recently diagnosed early stage prostate cancer. Patient has a prior history of advanced base of tongue carcinoma undergoing a radical course of radiation along with radiosensitizing chemotherapy. He is followed in our clinic for this issue by Dr. Pablo Ledger. Patient was initially found to have an elevated PSA and proceeded to undergo work up this issue. On transrectal ultrasound the patient was noted to have a 42 cubic centimeter prostate. 3/12 cores showed a Gleason's 6 adenocarcinoma the prostate. The patient is exploring his options for treatment and is now seen in radiation oncology for further evaluation. He has also met with Dr. Charlotta Newton  for an opinion concerning radiation therapy who recommended that he proceed with surgery for definitive management.Marland Kitchen  PREVIOUS RADIATION THERAPY: Yes , Left base of tongue carcinoma, 70.2 gray. Patient completed his treatments in [place] 07/03/2010  PAST MEDICAL HISTORY:  has a past medical history of Borderline diabetes; Carcinoma of base of tongue (2011); Crohn disease; radiation therapy; DVT (deep venous thrombosis); Pneumonia; Insomnia; and Prostate cancer.    PAST SURGICAL HISTORY: Past Surgical History  Procedure Laterality Date  . Port acath placement    . Lymphectomy    . Peg tube placement    . Prostate biopsy  08/23/14  . Port-a-cath removal      FAMILY HISTORY: family history includes Cancer in his  mother.  SOCIAL HISTORY:  reports that he quit smoking about 28 years ago. He does not have any smokeless tobacco history on file. He reports that he drinks about 2.4 oz of alcohol per week. He reports that he does not use illicit drugs.  ALLERGIES: Ciprofloxacin; Levofloxacin; and Moxifloxacin  MEDICATIONS:  Current Outpatient Prescriptions  Medication Sig Dispense Refill  . CREON 24000 UNITS CPEP Take by mouth 3 (three) times daily with meals. 3 capsules three times daily    . Cyanocobalamin (VITAMIN B 12 PO) Take 2 tablets by mouth daily.    Marland Kitchen ibuprofen (ADVIL,MOTRIN) 200 MG tablet Take 600 mg by mouth every 6 (six) hours as needed. PAIN    . traMADol (ULTRAM) 50 MG tablet Take 50 mg by mouth every 8 (eight) hours as needed. For pain.    . valACYclovir (VALTREX) 500 MG tablet Take 500 mg by mouth daily as needed. For outbreaks.    Marland Kitchen zolpidem (AMBIEN) 10 MG tablet     . [DISCONTINUED] colesevelam (WELCHOL) 625 MG tablet Take 1,250 mg by mouth at bedtime.      . [DISCONTINUED] Eszopiclone (ESZOPICLONE) 3 MG TABS Take 3 mg by mouth at bedtime. Take immediately before bedtime     . [DISCONTINUED] gabapentin (NEURONTIN) 250 MG/5ML solution Take 250 mg by mouth at bedtime.       No current facility-administered medications for this encounter.    REVIEW OF SYSTEMS:  A 15 point review of systems is documented in the electronic medical record. This was obtained by the nursing staff. However, I reviewed this with the patient to discuss relevant findings and make appropriate changes.  The patient completed the international prostate symptom score with total score of 8 representing early moderate symptomatology. Most significant scores were in urinary urgency.  The patient denies any new bony pain. Patient admits to some mild erectile dysfunction recently but attributes this to possible stress.   PHYSICAL EXAM:  height is _0  (1.778 m) and weight is 158 lb 4.8 oz (71.804 kg). His oral temperature  is 98.6 F (37 C). His blood pressure is 148/98 and his pulse is 86. His respiration is 16.   Detailed exam not performed today    ECOG = 0  0 - Asymptomatic (Fully active, able to carry on all predisease activities without restriction)  LABORATORY DATA:  Lab Results  Component Value Date   WBC 8.8 02/15/2012   HGB 14.5 02/15/2012   HCT 41.5 02/15/2012   MCV 86.1 02/15/2012   PLT 122* 02/15/2012   NEUTROABS 7.8* 02/15/2012   Lab Results  Component Value Date   NA 138 02/15/2012   K 3.5 02/15/2012   CL 104 02/15/2012   CO2 24 02/15/2012   GLUCOSE 117* 02/15/2012   CREATININE 0.80 02/15/2012   CALCIUM 9.3 02/15/2012      RADIOGRAPHY: No results found.    IMPRESSION: Stage T1c, Gleason's 6 (3+3) adenocarcinoma the prostate, PSA 5.83.  I discussed with patient that his prognosis relates to his presenting PSA Gleason score and clinical stage. These issues are all favorable for the patient. We discussed watchful waiting, radical prostatectomy and radiation therapy options including external beam radiation therapy and radioactive seed implantation. In light of patient's young age I would not recommend watchful waiting in this situation. With the patient's History of Crohn's disease I would be less hesitant and recommending external beam radiation therapy as part of his management. We discussed potential advantages of radioactive seed implantation over surgery, that being the erectile dysfunction issues and incontinence issues. I also discussed with the patient that my first recommendation would be to proceed with radical prostatectomy particularly as it relates to his young age and how radiation therapy may potentially interact with his bowel issues.  PLAN: the patient will meet again with the surgeon for further discussion. The patient will likely proceed with radical prostatectomy in the near future.  He will continue followup for his head and neck cancer with Dr. Pablo Ledger. I spent 45  minutes minutes face to face with the patient and more than 50% of that time was spent in counseling and/or coordination of care.   ------------------------------------------------  Blair Promise, PhD, MD

## 2014-09-27 ENCOUNTER — Encounter: Payer: Self-pay | Admitting: *Deleted

## 2014-09-27 NOTE — Progress Notes (Signed)
[place] Psychosocial Distress Screening Clinical Social Work  Clinical Social Work was referred by distress screening protocol.  The patient scored a 7 on the Psychosocial Distress Thermometer which indicates moderate distress. Clinical Social Worker phoned pt to assess for distress and other psychosocial needs. Pt's phone went to vm and CSW left message to return call.  ONCBCN DISTRESS SCREENING 09/26/2014  Screening Type Initial Screening  Distress experienced in past week (1-10) 7  Practical problem type Insurance  Emotional problem type Nervousness/Anxiety  Information Concerns Type Lack of info about treatment    Clinical Social Worker follow up needed: Yes.    If yes, follow up plan: CSW awaits return call Loren Racer, [place]  [facility] Phone: [phone] Fax: [phone]

## 2015-02-14 ENCOUNTER — Ambulatory Visit: Payer: Self-pay | Admitting: Skilled Nursing Facility1

## 2015-02-21 ENCOUNTER — Ambulatory Visit: Payer: 59 | Attending: Radiation Oncology | Admitting: Radiation Oncology

## 2015-03-06 NOTE — Progress Notes (Signed)
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  He is now almost 5 years out from treatment. He continues to have dry mouth. His main complaints are of quadricep weakness, neck pain and weakness and difficulty concentrating.  He would liek to start riding his bike or running agin.  Running is difficult due to his quad weakness and pain as well as the dry mouth. He feels he could go back to bicycle riding easier. He wants to go to the gym but doesn't know where to start. He also admits his neck seems thin and he has tenderness to touch as well as pain with turning his neck and even to light touch sometimes. He has no swelling. He has been taking tramadol which controls the pain and also helps him with his concentration. He was scoped by Dr. Laurance Flatten in February when he presented complaining of difficulty swallowing pills. His scope was normal as was an endoscopy. He follows up with Dr. Laurance Flatten in a year or as needed. He is on watchful waiting for his prostate cancer and is having a SCCa of the skin removed by dermatology next week.   Allergies:  Allergies  Allergen Reactions  . Ciprofloxacin Shortness Of Breath  . Levofloxacin Anaphylaxis  . Moxifloxacin Anaphylaxis    Medications:  Current Outpatient Prescriptions  Medication Sig Dispense Refill  . CREON 24000 UNITS CPEP Take by mouth 3 (three) times daily with meals. 3 capsules three times daily     . Cyanocobalamin (VITAMIN B 12 PO) Take 2 tablets by mouth daily.    Marland Kitchen ibuprofen (ADVIL,MOTRIN) 200 MG tablet Take 600 mg by mouth every 6 (six) hours as needed. PAIN    . metoprolol succinate (TOPROL-XL) 50 MG 24 hr tablet Take 1 tablet by mouth.    Marland Kitchen omeprazole (PRILOSEC) 20 MG capsule Take 40 mg by mouth.    . traMADol (ULTRAM) 50 MG tablet Take 50 mg by mouth every 8 (eight) hours as needed. For pain.    . valACYclovir (VALTREX) 500 MG tablet Take 500 mg by mouth daily as needed. For outbreaks.    Marland Kitchen zolpidem (AMBIEN) 10 MG tablet     . [DISCONTINUED] colesevelam (WELCHOL) 625 MG tablet Take 1,250 mg by mouth at bedtime.      . [DISCONTINUED] Eszopiclone (ESZOPICLONE) 3 MG TABS Take 3 mg by mouth at bedtime. Take immediately before bedtime     . [DISCONTINUED] gabapentin (NEURONTIN) 250 MG/5ML solution Take 250 mg by mouth at bedtime.       No current facility-administered medications for this encounter.    Physical Exam:  Filed Vitals:   03/07/15 1555  BP: 123/75  Pulse: 82  Temp: 98.1 F (36.7 C)  Resp: 16   Wt Readings from Last 3 Encounters:  09/26/14 158 lb 4.8 oz (71.804 kg)  02/14/14 170 lb 1.6 oz (77.157 kg)  03/08/12 150 lb 14.4 oz (68.448 kg)  Fibrosis of the left neck. No palpable evidence  of disease in bilateral cervical, supraclavicular or submandibular regions.  No palpable evidence of disease in bilateral base of tongue.    IMPRESSION:  Anthony Washington is a 59 y.o. male status post neoadjuvant chemotherapy followed by radiation and Erbitux with no evidence of disease and continuing side effects from treatment now 5 years out from treatment  PLAN: Refer to neurorehab. Refer to physical therapy for re-conditioning and strength training s/p steroid myopathy. Follow up with Dr. Laurance Flatten annually. Continue regular dental follow up and follow up with PCP for TSH once a year. Call with questions or for follow up prn.   Thea Silversmith, MD

## 2015-03-07 ENCOUNTER — Encounter: Payer: Self-pay | Admitting: Radiation Oncology

## 2015-03-07 ENCOUNTER — Ambulatory Visit
Admission: RE | Admit: 2015-03-07 | Discharge: 2015-03-07 | Disposition: A | Discharge status: Home / Self Care | Payer: BLUE CROSS/BLUE SHIELD | Source: Ambulatory Visit | Attending: Radiation Oncology | Admitting: Radiation Oncology

## 2015-03-07 VITALS — BP 123/75 | HR 82 | Temp 98.1°F | Resp 16 | Ht 70.0 in | Wt 166.9 lb

## 2015-03-07 DIAGNOSIS — C01 Malignant neoplasm of base of tongue: Secondary | ICD-10-CM

## 2015-03-07 NOTE — Progress Notes (Signed)
Tresa Endo here for follow up.  He continues to have neck pain that started below each ear that radiated down the sides of his neck.  He says it is a constant ache and also hurts to touch it.  He is taking 1/2 a tramadol each day which helps.  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  He had it biopsied on Monday.  He is going to have it removed on 5/5.  He reports continuing to have issues with his temperament after chemotherapy.  He is also concerned about his loss of muscle mass in his legs and shoulders.  He is not able to run anymore due to pain in his quadricepts.  He reports he has a dry mouth.  He can eat whatever he wants.  He saw his ENT, Dr. Laurance Flatten in February.  His weight is stable.  BP 123/75 mmHg  Pulse 82  Temp(Src) 98.1 F (36.7 C) (Oral)  Resp 16  Ht 5\' 10"  (1.778 m)  Wt 166 lb 14.4 oz (75.705 kg)  BMI 23.95 kg/m2  SpO2 99%   Wt Readings from Last 3 Encounters:  09/26/14 158 lb 4.8 oz (71.804 kg)  02/14/14 170 lb 1.6 oz (77.157 kg)  03/08/12 150 lb 14.4 oz (68.448 kg)

## 2015-03-20 ENCOUNTER — Ambulatory Visit: Payer: BLUE CROSS/BLUE SHIELD | Attending: Radiation Oncology | Admitting: Physical Therapy

## 2016-04-26 ENCOUNTER — Telehealth: Payer: Self-pay | Admitting: *Deleted

## 2016-04-26 NOTE — Telephone Encounter (Signed)
Returned phone call requested an appointment to see Dr. Pablo Ledger only.  Stated he is having neck pain and has a trip to go to  [place] July 2 for  three weeks.  Has an appointment for May 07, 2016 at 3:30 p.m.

## 2016-05-04 DIAGNOSIS — I10 Essential (primary) hypertension: Secondary | ICD-10-CM | POA: Insufficient documentation

## 2016-05-04 DIAGNOSIS — Z85819 Personal history of malignant neoplasm of unspecified site of lip, oral cavity, and pharynx: Secondary | ICD-10-CM | POA: Insufficient documentation

## 2016-05-04 DIAGNOSIS — E119 Type 2 diabetes mellitus without complications: Secondary | ICD-10-CM | POA: Insufficient documentation

## 2016-05-04 DIAGNOSIS — G4700 Insomnia, unspecified: Secondary | ICD-10-CM | POA: Insufficient documentation

## 2016-05-04 DIAGNOSIS — A6000 Herpesviral infection of urogenital system, unspecified: Secondary | ICD-10-CM | POA: Insufficient documentation

## 2016-05-04 DIAGNOSIS — K5090 Crohn's disease, unspecified, without complications: Secondary | ICD-10-CM | POA: Insufficient documentation

## 2016-05-07 ENCOUNTER — Telehealth: Payer: Self-pay | Admitting: *Deleted

## 2016-05-07 ENCOUNTER — Ambulatory Visit
Admission: RE | Admit: 2016-05-07 | Discharge: 2016-05-07 | Disposition: A | Discharge status: Home / Self Care | Payer: BLUE CROSS/BLUE SHIELD | Source: Ambulatory Visit | Attending: Radiation Oncology | Admitting: Radiation Oncology

## 2016-05-07 ENCOUNTER — Ambulatory Visit: Payer: Self-pay | Admitting: Radiation Oncology

## 2016-05-07 NOTE — Telephone Encounter (Signed)
Returned call very anger stated "I have an appointment at 3:50 p.m. not 1:00 p.m. with Dr. Pablo Ledger.  I corrected him and said Dr. Pablo Ledger is not here today, your appointment is with Dr. Isidore Moos.  I will see you at 3:50 p.m.  He stated my appointment is with Dr. Pablo Ledger and I can not believe that you people would give me an appointment with a doctor that I dont even know.   I asked him are you saying that you do not want to see Dr. Isidore Moos ?   He said "I am flying to [place] on Sunday  And I will deal with this later and he hung up the telephone.

## 2016-05-07 NOTE — Telephone Encounter (Signed)
Left a message to call back was late for 1300 appointment to see Dr. Isidore Moos.  Ask for Tonie at [phone].

## 2016-05-20 ENCOUNTER — Ambulatory Visit: Payer: BLUE CROSS/BLUE SHIELD | Admitting: Endocrinology

## 2016-06-10 ENCOUNTER — Ambulatory Visit: Payer: BLUE CROSS/BLUE SHIELD | Admitting: *Deleted

## 2016-06-21 ENCOUNTER — Ambulatory Visit: Payer: BLUE CROSS/BLUE SHIELD | Admitting: Dietician

## 2016-06-23 ENCOUNTER — Encounter: Payer: Self-pay | Admitting: Endocrinology

## 2016-06-23 ENCOUNTER — Ambulatory Visit (INDEPENDENT_AMBULATORY_CARE_PROVIDER_SITE_OTHER): Payer: Self-pay | Admitting: Endocrinology

## 2016-06-23 VITALS — BP 138/78 | HR 61 | Temp 97.8°F | Ht 70.5 in | Wt 166.6 lb

## 2016-06-23 DIAGNOSIS — E119 Type 2 diabetes mellitus without complications: Secondary | ICD-10-CM

## 2016-06-23 MED ORDER — GLUCOSE BLOOD VI STRP: 1.0000 | ORAL_STRIP | Freq: Every day | 12 refills | Status: AC

## 2016-06-23 NOTE — Patient Instructions (Addendum)
There are several possible causes for your diabetes.  In any case,  good diet and exercise significantly improve the control of your diabetes.  please let me know if you wish to be referred to a dietician.  high blood sugar is very risky to your health.  you should see an eye doctor and dentist every year.  It is very important to get all recommended vaccinations.  Controlling your blood pressure and cholesterol drastically reduces the damage diabetes does to your body.  Those who smoke should quit.  Please discuss these with your doctor.  check your blood sugar once a day.  vary the time of day when you check, between before the 3 meals, and at bedtime.  also check if you have symptoms of your blood sugar being too high or too low.  please keep a record of the readings and bring it to your next appointment here (or you can bring the meter itself).  You can write it on any piece of paper.  please call us sooner if your blood sugar goes below 70, or if you have a lot of readings over 200.  If you need to take steroids in the future, I will work with you on the resulting increased blood sugar.  Here is a list of possible diabetes medications.  Please let us know if you decide to start one.   Please come back for a follow-up appointment in 2-3 months.

## 2016-06-23 NOTE — Progress Notes (Signed)
Subjective:    Patient ID: Anthony Washington, male    DOB: 11/16/1955, 60 y.o.   MRN: [id number]  HPI pt states DM was dx'ed in 2010; he has mild neuropathy of the lower extremities; he is unaware of any associated chronic complications; he took on insulin from 2010-2012, when he was on steriods; pt says his diet and exercise are both excellent; he has never had severe hypoglycemia or DKA.  He did not tolerate metformin (GI sxs).  He has had a steadily increasing a1c over the past few years.  He has had several episodes of pancreatitis.  He has taken intermittent steriods for Crohn's dz and oral cancer.  He has not recently been on medication for diabetes.  Past Medical History:  Diagnosis Date  . Borderline diabetes   . 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 Crohn disease ([place])   . Diabetes ([place])   . DVT (deep venous thrombosis) ([facility])    history, right shoulder  . Hx of radiation therapy    completed 07/03/2010, concurrent chemo  . Insomnia   . Pneumonia    hx of  . Prostate cancer [facility])     Past Surgical History:  Procedure Laterality Date  . lymphectomy    . PEG TUBE PLACEMENT    . port acath placement    . PORT-A-CATH REMOVAL    . PROSTATE BIOPSY  08/23/14    Social History   Social History  . Marital status: Married    Spouse name: N/A  . Number of children: 0  . Years of education: N/A   Occupational History  . self employed    Social History Main Topics  . Smoking status: Former Smoker    Packs/day: 1.00    Years: 7.00    Quit date: 03/05/1986  . Smokeless tobacco: Not on file  . Alcohol use 2.4 oz/week    4 Standard drinks or equivalent per week     Comment: occasional  . Drug use: No  . Sexual activity: Not on file   Other Topics Concern  . Not on file   Social History Narrative  . No narrative on file    Current Outpatient Prescriptions on File Prior to Visit  Medication Sig Dispense Refill  . Cyanocobalamin  (VITAMIN B 12 PO) Take 2 tablets by mouth daily.    . metoprolol succinate (TOPROL-XL) 50 MG 24 hr tablet Take 1 tablet by mouth.    Marland Kitchen omeprazole (PRILOSEC) 20 MG capsule Take 40 mg by mouth.    . traMADol (ULTRAM) 50 MG tablet Take 50 mg by mouth every 8 (eight) hours as needed. For pain.    . valACYclovir (VALTREX) 500 MG tablet     . [DISCONTINUED] colesevelam (WELCHOL) 625 MG tablet Take 1,250 mg by mouth at bedtime.      . [DISCONTINUED] Eszopiclone (ESZOPICLONE) 3 MG TABS Take 3 mg by mouth at bedtime. Take immediately before bedtime     . [DISCONTINUED] gabapentin (NEURONTIN) 250 MG/5ML solution Take 250 mg by mouth at bedtime.       No current facility-administered medications on file prior to visit.     Allergies  Allergen Reactions  . Ciprofloxacin Shortness Of Breath and Other (See Comments)  . Levofloxacin Anaphylaxis    Other reaction(s): redness,tingling  . Moxifloxacin Anaphylaxis  . Trazodone     Other reaction(s): SENT PT TO HOSPITAL , COULDN'T WALK HAD TO CALL 911  Family History  Problem Relation Age of Onset  . Cancer Mother     gallbladder cancer  . Diabetes Neg Hx     BP 138/78 (BP Location: Left Arm, Patient Position: Sitting, Cuff Size: Normal)   Pulse 61   Temp 97.8 F (36.6 C) (Oral)   Ht 5' 10.5" (1.791 m)   Wt 166 lb 9.6 oz (75.6 kg)   SpO2 97%   BMI 23.57 kg/m   Review of Systems denies weight loss, blurry vision, headache, chest pain, sob, n/v, urinary frequency, excessive diaphoresis, depression, cold intolerance, rhinorrhea, and easy bruising.   He has intermittent leg cramps     Objective:   Physical Exam VS: see vs page GEN: no distress HEAD: head: no deformity eyes: no periorbital swelling, no proptosis external nose and ears are normal mouth: no lesion seen NECK: supple, thyroid is not enlarged CHEST WALL: no deformity LUNGS: clear to auscultation CV: reg rate and rhythm, no murmur ABD: abdomen is soft, nontender.  no  hepatosplenomegaly.  not distended.  no hernia MUSCULOSKELETAL: muscle bulk and strength are grossly normal.  no obvious joint swelling.  gait is normal and steady EXTEMITIES: no deformity.  no ulcer on the feet.  feet are of normal color and temp.  no edema PULSES: dorsalis pedis intact bilat.  no carotid bruit NEURO:  cn 2-12 grossly intact.   readily moves all 4's.  sensation is intact to touch on the feet SKIN:  Normal texture and temperature.  No rash or suspicious lesion is visible.   NODES:  None palpable at the neck PSYCH: alert, well-oriented.  Does not appear anxious nor depressed.  outside test results are reviewed:  A1c=7.1%  Today:  a1c=6.7%  CT (2013) pancreas is atrophic.   I have reviewed outside records, and summarized:  Pt was noted to have increasing a1c, and referred here, at patient's request.  He has not recently been on steriods   Lab Results  Component Value Date   CREATININE 0.80 02/15/2012   BUN 18 02/15/2012   NA 138 02/15/2012   K 3.5 02/15/2012   CL 104 02/15/2012   CO2 24 02/15/2012   Lab Results  Component Value Date   CALCIUM 9.3 02/15/2012      Assessment & Plan:  DM: pancreatitis vs evolving type 1.   Crohn's Dz.  He may need steroids in the future.

## 2016-06-24 ENCOUNTER — Encounter: Payer: Self-pay | Admitting: Endocrinology

## 2016-07-16 ENCOUNTER — Encounter: Payer: Self-pay | Admitting: Dietician

## 2016-07-16 ENCOUNTER — Encounter: Payer: BLUE CROSS/BLUE SHIELD | Attending: Endocrinology | Admitting: Dietician

## 2016-07-16 DIAGNOSIS — Z713 Dietary counseling and surveillance: Secondary | ICD-10-CM | POA: Insufficient documentation

## 2016-07-16 DIAGNOSIS — E0800 Diabetes mellitus due to underlying condition with hyperosmolarity without nonketotic hyperglycemic-hyperosmolar coma (NKHHC): Secondary | ICD-10-CM

## 2016-07-16 DIAGNOSIS — E119 Type 2 diabetes mellitus without complications: Secondary | ICD-10-CM | POA: Insufficient documentation

## 2016-07-16 NOTE — Progress Notes (Signed)
Diabetes Self-Management Education  Visit Type: First/Initial  Appt. Start Time: 1445 Appt. End Time: [id number]  07/16/2016  Mr. Anthony Washington, identified by name and date of birth, is a 60 y.o. male with a diagnosis of Diabetes: Type 2.  Other hx includes:  Neuropathy, pancreatitis, crohn's disease with intermittent steroids and infrequent flairs requiring w resections due to obstruction, oral cancer and prostate cancer both in remission.  His last A1C was 6.7% 06/23/16.  He used to be an avid runner and cyclist prior to cancer and has had difficulty resuming this due to inability to perform at the same level.  His weight has remained relatively stable and wants to gain about 5 lbs.    Patient lives alone.  He has a girlfriend in [place] who will be visiting him for 2 weeks soon and will be helping him learn more cooking skills.  He does cook but lacks variety.  His diet is mostly whole grains, vegetables, meat, legumes, avocado, nuts but some chips and cheese.  He owns an accounting firm.  ASSESSMENT  Height 5' 10.5" (1.791 m), weight 168 lb (76.2 kg). Body mass index is 23.76 kg/m.      Diabetes Self-Management Education - 07/16/16 1458      Visit Information   Visit Type First/Initial     Initial Visit   Diabetes Type Type 1  ? type 1 vs 2   Are you currently following a meal plan? No   Are you taking your medications as prescribed? Not on Medications   Date Diagnosed 2010     Psychosocial Assessment   Patient Belief/Attitude about Diabetes Motivated to manage diabetes   Self-care barriers None   Self-management support Doctor's office;Friends   Other persons present Patient   Patient Concerns Nutrition/Meal planning;Glycemic Control   Special Needs None   Preferred Learning Style No preference indicated   Learning Readiness Ready   How often do you need to have someone help you when you read instructions, pamphlets, or other written materials from your doctor or pharmacy? 1 - Never    What is the last grade level you completed in school? Master's degree plus     Pre-Education Assessment   Patient understands the diabetes disease and treatment process. Needs Review   Patient understands incorporating nutritional management into lifestyle. Needs Review   Patient undertands incorporating physical activity into lifestyle. Needs Review   Patient understands using medications safely. Demonstrates understanding / competency   Patient understands monitoring blood glucose, interpreting and using results Needs Review   Patient understands prevention, detection, and treatment of acute complications. Demonstrates understanding / competency   Patient understands prevention, detection, and treatment of chronic complications. Demonstrates understanding / competency   Patient understands how to develop strategies to address psychosocial issues. Needs Review   Patient understands how to develop strategies to promote health/change behavior. Needs Review     Complications   Last HgB A1C per patient/outside source 6.7 %  06/23/16   How often do you check your blood sugar? 0 times/day (not testing)   Have you had a dilated eye exam in the past 12 months? Yes   Have you had a dental exam in the past 12 months? Yes   Are you checking your feet? Yes   How many days per week are you checking your feet? 7     Dietary Intake   Breakfast Oatmeal, berries OR plain yogurt, berries    Snack (morning) none   Lunch leftover pork steak,  legumes, brown rice   Snack (afternoon) occasional milk and 2 delux graham cookies and then Nachos, Queso at [employer], vegetable, brown rice or beans   Snack (evening) occasional cereal (special K) and milk OR low carb ice cream   Beverage(s) bottled water, 2 beer some days, diet Dr. Malachi Bonds, rare mixed drink or Whisky, coffee with sweet and low and cream     Exercise   Exercise Type Moderate (swimming / aerobic walking)   How many days per  week to you exercise? 2     Patient Education   Previous Diabetes Education Yes (please comment)  when first diagnosed   Disease state  Definition of diabetes, type 1 and 2, and the diagnosis of diabetes   Nutrition management  Role of diet in the treatment of diabetes and the relationship between the three main macronutrients and blood glucose level;Food label reading, portion sizes and measuring food.;Carbohydrate counting;Meal options for control of blood glucose level and chronic complications.;Other (comment)  nutrition and chronic health issues   Physical activity and exercise  Role of exercise on diabetes management, blood pressure control and cardiac health.   Monitoring Purpose and frequency of SMBG.;Identified appropriate SMBG and/or A1C goals.;Yearly dilated eye exam;Daily foot exams   Chronic complications Dental care   Psychosocial adjustment Worked with patient to identify barriers to care and solutions   Personal strategies to promote health Helped patient develop diabetes management plan for (enter comment)  exercise     Individualized Goals (developed by patient)   Nutrition Follow meal plan discussed;General guidelines for healthy choices and portions discussed   Physical Activity Exercise 5-7 days per week;30 minutes per day   Medications Not Applicable   Monitoring  Not Applicable   Reducing Risk increase portions of nuts and seeds;do foot checks daily;increase portions of healthy fats   Health Coping discuss diabetes with (comment)  MD/RD/friend     Post-Education Assessment   Patient understands the diabetes disease and treatment process. Demonstrates understanding / competency   Patient understands incorporating nutritional management into lifestyle. Demonstrates understanding / competency   Patient undertands incorporating physical activity into lifestyle. Demonstrates understanding / competency   Patient understands using medications safely. Demonstrates  understanding / competency   Patient understands monitoring blood glucose, interpreting and using results Demonstrates understanding / competency   Patient understands prevention, detection, and treatment of acute complications. Demonstrates understanding / competency   Patient understands prevention, detection, and treatment of chronic complications. Demonstrates understanding / competency   Patient understands how to develop strategies to address psychosocial issues. Demonstrates understanding / competency   Patient understands how to develop strategies to promote health/change behavior. Demonstrates understanding / competency     Outcomes   Expected Outcomes Demonstrated interest in learning. Expect positive outcomes   Future DMSE PRN   Program Status Completed      Individualized Plan for Diabetes Self-Management Training:   Learning Objective:  Patient will have a greater understanding of diabetes self-management. Patient education plan is to attend individual and/or group sessions per assessed needs and concerns.   Plan:   Patient Instructions  Be as active as possible.  Choose some form of activity most days.  (biking, running, walking, dance, racket ball, soccer, or what you enjoy).  Aim for 4 Carb Choices per meal (60 grams) +/- 1 either way  Aim for 0-2 Carbs per snack if hungry  Include protein in moderation with your meals and snacks Consider reading food labels  for Total Carbohydrate and Fat Grams of foods Consider checking BG at alternate times per day as directed by MD   Discussed need to take his pancreatic enzyme consistently with each meal.     Expected Outcomes:  Demonstrated interest in learning. Expect positive outcomes  Education material provided: Living Well with Diabetes, Food label handouts, A1C conversion sheet, Meal plan card and Snack sheet  If problems or questions, patient to contact team via:  Phone and Email  Future DSME appointment: PRN

## 2016-07-16 NOTE — Patient Instructions (Addendum)
Be as active as possible.  Choose some form of activity most days.  (biking, running, walking, dance, racket ball, soccer, or what you enjoy).  Aim for 4 Carb Choices per meal (60 grams) +/- 1 either way  Aim for 0-2 Carbs per snack if hungry  Include protein in moderation with your meals and snacks Consider reading food labels for Total Carbohydrate and Fat Grams of foods Consider checking BG at alternate times per day as directed by MD   Discussed need to take his pancreatic enzyme consistently with each meal.

## 2016-11-12 ENCOUNTER — Ambulatory Visit (INDEPENDENT_AMBULATORY_CARE_PROVIDER_SITE_OTHER): Payer: Self-pay | Admitting: Endocrinology

## 2016-11-12 ENCOUNTER — Encounter: Payer: Self-pay | Admitting: Endocrinology

## 2016-11-12 VITALS — BP 126/82 | HR 74 | Ht 70.5 in | Wt 168.0 lb

## 2016-11-12 DIAGNOSIS — E0800 Diabetes mellitus due to underlying condition with hyperosmolarity without nonketotic hyperglycemic-hyperosmolar coma (NKHHC): Secondary | ICD-10-CM

## 2016-11-12 LAB — POCT GLYCOSYLATED HEMOGLOBIN (HGB A1C): Hemoglobin A1C: 7.1

## 2016-11-12 MED ORDER — PIOGLITAZONE HCL 45 MG PO TABS: 45.0000 mg | ORAL_TABLET | Freq: Every day | ORAL | 11 refills | Status: DC

## 2016-11-12 NOTE — Progress Notes (Signed)
Subjective:    Patient ID: Anthony Washington, male    DOB: 1956/10/13, 61 y.o.   MRN: [id number]  HPI  Pt returns for f/u of diabetes mellitus: DM type: 2 vs due to pancreatitis. Dx'ed: [id number] Complications: polyneuropathy Therapy: none DKA: never Severe hypoglycemia: never Pancreatitis: several episodes Other: he took on insulin from 2010-2012, when he was on steroids for Crohn's dz; he did not tolerate metformin (GI sxs) Interval history: He has intermittent polyuria.  Otherwise, pt states he feels well in general. Past Medical History:  Diagnosis Date  . Borderline diabetes   . 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 Crohn disease ([place])   . Diabetes ([place])   . DVT (deep venous thrombosis) ([facility])    history, right shoulder  . Hx of radiation therapy    completed 07/03/2010, concurrent chemo  . Insomnia   . Pneumonia    hx of  . Prostate cancer [facility])     Past Surgical History:  Procedure Laterality Date  . lymphectomy    . PEG TUBE PLACEMENT    . port acath placement    . PORT-A-CATH REMOVAL    . PROSTATE BIOPSY  08/23/14    Social History   Social History  . Marital status: Married    Spouse name: N/A  . Number of children: 0  . Years of education: N/A   Occupational History  . self employed    Social History Main Topics  . Smoking status: Former Smoker    Packs/day: 1.00    Years: 7.00    Quit date: 03/05/1986  . Smokeless tobacco: Never Used  . Alcohol use 2.4 oz/week    4 Standard drinks or equivalent per week     Comment: occasional  . Drug use: No  . Sexual activity: Not on file   Other Topics Concern  . Not on file   Social History Narrative  . No narrative on file    Current Outpatient Prescriptions on File Prior to Visit  Medication Sig Dispense Refill  . Cyanocobalamin (VITAMIN B 12 PO) Take 2 tablets by mouth daily.    Marland Kitchen glucose blood (ONETOUCH VERIO) test strip 1 each by Other route daily. And lancets  1/day 100 each 12  . metoprolol succinate (TOPROL-XL) 50 MG 24 hr tablet Take 1 tablet by mouth.    Marland Kitchen omeprazole (PRILOSEC) 20 MG capsule Take 40 mg by mouth.    . Pancrelipase, Lip-Prot-Amyl, (ZENPEP) 40000 units CPEP Take 40,000 capsules by mouth daily.    . traMADol (ULTRAM) 50 MG tablet Take 50 mg by mouth every 8 (eight) hours as needed. For pain.    . valACYclovir (VALTREX) 500 MG tablet     . [DISCONTINUED] colesevelam (WELCHOL) 625 MG tablet Take 1,250 mg by mouth at bedtime.      . [DISCONTINUED] Eszopiclone (ESZOPICLONE) 3 MG TABS Take 3 mg by mouth at bedtime. Take immediately before bedtime     . [DISCONTINUED] gabapentin (NEURONTIN) 250 MG/5ML solution Take 250 mg by mouth at bedtime.       No current facility-administered medications on file prior to visit.     Allergies  Allergen Reactions  . Ciprofloxacin Shortness Of Breath and Other (See Comments)  . Levofloxacin Anaphylaxis    Other reaction(s): redness,tingling  . Moxifloxacin Anaphylaxis  . Trazodone     Other reaction(s): SENT PT TO HOSPITAL , COULDN'T WALK HAD TO CALL 911     Family  History  Problem Relation Age of Onset  . Cancer Mother     gallbladder cancer  . Diabetes Neg Hx     BP 126/82   Pulse 74   Ht 5' 10.5" (1.791 m)   Wt 168 lb (76.2 kg)   SpO2 95%   BMI 23.76 kg/m   Review of Systems He denies hypoglycemia.      Objective:   Physical Exam VITAL SIGNS:  See vs page GENERAL: no distress Pulses: dorsalis pedis intact bilat.   MSK: no deformity of the feet.  CV: no leg edema.   Skin:  no ulcer on the feet.  normal color and temp on the feet.  Neuro: sensation is intact to touch on the feet, but decreased from normal.     A1c=7.1%    Assessment & Plan:  DM, uncertain cause, worse Pancreatitis: this limits rx options.  Patient is advised the following: Patient Instructions  check your blood sugar once a day.  vary the time of day when you check, between before the 3 meals, and at  bedtime.  also check if you have symptoms of your blood sugar being too high or too low.  please keep a record of the readings and bring it to your next appointment here (or you can bring the meter itself).  You can write it on any piece of paper.  please call us sooner if your blood sugar goes below 70, or if you have a lot of readings over 200.  If you need to take steroids in the future, I will work with you on the resulting increased blood sugar.  I have sent a prescription to your pharmacy, for "pioglitizone."    Please come back for a follow-up appointment in 3-4 months.

## 2016-11-12 NOTE — Patient Instructions (Addendum)
check your blood sugar once a day.  vary the time of day when you check, between before the 3 meals, and at bedtime.  also check if you have symptoms of your blood sugar being too high or too low.  please keep a record of the readings and bring it to your next appointment here (or you can bring the meter itself).  You can write it on any piece of paper.  please call us sooner if your blood sugar goes below 70, or if you have a lot of readings over 200.  If you need to take steroids in the future, I will work with you on the resulting increased blood sugar.  I have sent a prescription to your pharmacy, for "pioglitizone."    Please come back for a follow-up appointment in 3-4 months.

## 2017-01-21 ENCOUNTER — Telehealth: Payer: Self-pay | Admitting: Endocrinology

## 2017-01-21 NOTE — Telephone Encounter (Signed)
Pt is letting us know that the pioglitizone is not changing anything for him and GI MD is wondering if this is what has caused his pancreatits flair ups.  HA1C was 7.4 this week

## 2017-01-24 NOTE — Telephone Encounter (Signed)
Message was reviewed on 01/24/2017. See message and please advise, Thanks!

## 2017-01-24 NOTE — Telephone Encounter (Signed)
Please move up next appt to next available.   

## 2017-01-24 NOTE — Telephone Encounter (Signed)
I contacted the patient and left a voicemail requesting the patient to call back and schedule his appointment.

## 2017-02-24 ENCOUNTER — Ambulatory Visit: Payer: Self-pay | Admitting: Endocrinology

## 2017-02-25 ENCOUNTER — Ambulatory Visit: Payer: Self-pay | Admitting: Endocrinology

## 2017-02-28 ENCOUNTER — Encounter: Payer: Self-pay | Admitting: Endocrinology

## 2017-02-28 ENCOUNTER — Ambulatory Visit (INDEPENDENT_AMBULATORY_CARE_PROVIDER_SITE_OTHER): Payer: BLUE CROSS/BLUE SHIELD | Admitting: Endocrinology

## 2017-02-28 VITALS — BP 136/92 | HR 72 | Ht 70.5 in | Wt 171.0 lb

## 2017-02-28 DIAGNOSIS — E559 Vitamin D deficiency, unspecified: Secondary | ICD-10-CM

## 2017-02-28 DIAGNOSIS — E0800 Diabetes mellitus due to underlying condition with hyperosmolarity without nonketotic hyperglycemic-hyperosmolar coma (NKHHC): Secondary | ICD-10-CM

## 2017-02-28 DIAGNOSIS — R252 Cramp and spasm: Secondary | ICD-10-CM

## 2017-02-28 MED ORDER — REPAGLINIDE 0.5 MG PO TABS: 0.5000 mg | ORAL_TABLET | Freq: Two times a day (BID) | ORAL | 11 refills | Status: AC

## 2017-02-28 NOTE — Patient Instructions (Addendum)
check your blood sugar once a day.  vary the time of day when you check, between before the 3 meals, and at bedtime.  also check if you have symptoms of your blood sugar being too high or too low.  please keep a record of the readings and bring it to your next appointment here (or you can bring the meter itself).  You can write it on any piece of paper.  please call us sooner if your blood sugar goes below 70, or if you have a lot of readings over 200.  I have sent a prescription to your pharmacy, for "repaglinide."    blood tests are requested for you today.  We'll let you know about the results. Please come back for a follow-up appointment in 3-4 months.

## 2017-02-28 NOTE — Progress Notes (Signed)
Subjective:    Patient ID: Anthony Washington, male    DOB: 1956/05/11, 61 y.o.   MRN: [id number]  HPI Pt returns for f/u of diabetes mellitus: DM type: 2 vs due to pancreatitis vs evolving type 1 Dx'ed: 1700 Complications: polyneuropathy Therapy: pioglitizone.  DKA: never Severe hypoglycemia: never Pancreatitis: several episodes Other: he took on insulin from 2010-2012, when he was on steroids for Crohn's dz; he did not tolerate metformin (GI sxs).   Interval history: He has intermittent polyuria.  Otherwise, pt states he feels well in general.  He stopped pioglitizone, due to lack of effect.   Past Medical History:  Diagnosis Date  . Borderline diabetes   . 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 Crohn disease ([place])   . Diabetes ([place])   . DVT (deep venous thrombosis) ([facility])    history, right shoulder  . Hx of radiation therapy    completed 07/03/2010, concurrent chemo  . Insomnia   . Pneumonia    hx of  . Prostate cancer [facility])     Past Surgical History:  Procedure Laterality Date  . lymphectomy    . PEG TUBE PLACEMENT    . port acath placement    . PORT-A-CATH REMOVAL    . PROSTATE BIOPSY  08/23/14    Social History   Social History  . Marital status: Married    Spouse name: N/A  . Number of children: 0  . Years of education: N/A   Occupational History  . self employed    Social History Main Topics  . Smoking status: Former Smoker    Packs/day: 1.00    Years: 7.00    Quit date: 03/05/1986  . Smokeless tobacco: Never Used  . Alcohol use 2.4 oz/week    4 Standard drinks or equivalent per week     Comment: occasional  . Drug use: No  . Sexual activity: Not on file   Other Topics Concern  . Not on file   Social History Narrative  . No narrative on file    Current Outpatient Prescriptions on File Prior to Visit  Medication Sig Dispense Refill  . Cyanocobalamin (VITAMIN B 12 PO) Take 2 tablets by mouth daily.    .  metoprolol succinate (TOPROL-XL) 50 MG 24 hr tablet Take 1 tablet by mouth.    Marland Kitchen omeprazole (PRILOSEC) 20 MG capsule Take 40 mg by mouth.    . Pancrelipase, Lip-Prot-Amyl, (ZENPEP) 40000 units CPEP Take 40,000 capsules by mouth daily.    . traMADol (ULTRAM) 50 MG tablet Take 50 mg by mouth every 8 (eight) hours as needed. For pain.    Marland Kitchen glucose blood (ONETOUCH VERIO) test strip 1 each by Other route daily. And lancets 1/day (Patient not taking: Reported on 02/28/2017) 100 each 12  . valACYclovir (VALTREX) 500 MG tablet     . [DISCONTINUED] colesevelam (WELCHOL) 625 MG tablet Take 1,250 mg by mouth at bedtime.      . [DISCONTINUED] Eszopiclone (ESZOPICLONE) 3 MG TABS Take 3 mg by mouth at bedtime. Take immediately before bedtime     . [DISCONTINUED] gabapentin (NEURONTIN) 250 MG/5ML solution Take 250 mg by mouth at bedtime.       No current facility-administered medications on file prior to visit.     Allergies  Allergen Reactions  . Ciprofloxacin Shortness Of Breath and Other (See Comments)  . Levofloxacin Anaphylaxis    Other reaction(s): redness,tingling  . Moxifloxacin Anaphylaxis  .  Trazodone     Other reaction(s): SENT PT TO HOSPITAL , COULDN'T WALK HAD TO CALL 911     Family History  Problem Relation Age of Onset  . Cancer Mother     gallbladder cancer  . Diabetes Neg Hx     BP (!) 136/92   Pulse 72   Ht 5' 10.5" (1.791 m)   Wt 171 lb (77.6 kg)   SpO2 97%   BMI 24.19 kg/m   Review of Systems No significant weight change.  He has leg cramps    Objective:   Physical Exam VITAL SIGNS:  See vs page GENERAL: no distress Pulses: dorsalis pedis intact bilat.   MSK: no deformity of the feet.  CV: no leg edema.   Skin:  no ulcer on the feet.  normal color and temp on the feet.  Neuro: sensation is intact to touch on the feet, but decreased from normal.   Lab Results  Component Value Date   HGBA1C 7.1 11/12/2016       Assessment & Plan:  Type 2 DM: he needs  increased rx, if it can be done with a regimen that avoids or minimizes hypoglycemia.  I told pt that pioglitizone takes months to work.  We'll reconsider this in the future if necessary.    Patient Instructions  check your blood sugar once a day.  vary the time of day when you check, between before the 3 meals, and at bedtime.  also check if you have symptoms of your blood sugar being too high or too low.  please keep a record of the readings and bring it to your next appointment here (or you can bring the meter itself).  You can write it on any piece of paper.  please call us sooner if your blood sugar goes below 70, or if you have a lot of readings over 200.  I have sent a prescription to your pharmacy, for "repaglinide."    blood tests are requested for you today.  We'll let you know about the results. Please come back for a follow-up appointment in 3-4 months.

## 2017-03-01 DIAGNOSIS — E559 Vitamin D deficiency, unspecified: Secondary | ICD-10-CM | POA: Insufficient documentation

## 2017-03-01 LAB — PTH, INTACT AND CALCIUM
Calcium: 8.9 mg/dL (ref 8.6–10.3)
PTH: 102 pg/mL — AB (ref 14–64)

## 2017-03-01 LAB — VITAMIN D 25 HYDROXY (VIT D DEFICIENCY, FRACTURES): VITD: 9.11 ng/mL — ABNORMAL LOW (ref 30.00–100.00)

## 2017-03-01 MED ORDER — VITAMIN D (ERGOCALCIFEROL) 1.25 MG (50000 UNIT) PO CAPS: 50000.0000 [IU] | ORAL_CAPSULE | ORAL | 0 refills | Status: AC

## 2017-03-02 ENCOUNTER — Telehealth: Payer: Self-pay

## 2017-03-02 LAB — POCT GLYCOSYLATED HEMOGLOBIN (HGB A1C): Hemoglobin A1C: 7.1

## 2017-03-02 NOTE — Telephone Encounter (Signed)
-----   Message from Renato Shin, MD sent at 03/01/2017 12:46 PM EDT ----- please call patient: Vitamin-d is really low I have sent a prescription to your pharmacy, for you to take this 3 times a week, x 1 month. Then please come in to recheck the blood test.

## 2017-03-02 NOTE — Telephone Encounter (Signed)
I contacted the patient and advised of message via voicemail. Requested a call back from the patient to schedule his 1 month lab appointment.

## 2017-03-11 ENCOUNTER — Ambulatory Visit: Payer: Self-pay | Admitting: Endocrinology

## 2017-03-20 ENCOUNTER — Other Ambulatory Visit: Payer: Self-pay | Admitting: Endocrinology

## 2017-06-30 ENCOUNTER — Ambulatory Visit: Payer: BLUE CROSS/BLUE SHIELD | Admitting: Endocrinology
